# Patient Record
Sex: Female | Born: 1975 | Race: White | Hispanic: No | Marital: Married | Smoking: Former smoker
Health system: Southern US, Community
[De-identification: ages and names within clinical notes are randomized; demographics above are authoritative.]

## PROBLEM LIST (undated history)

## (undated) DIAGNOSIS — E669 Obesity, unspecified: Secondary | ICD-10-CM

## (undated) DIAGNOSIS — D649 Anemia, unspecified: Secondary | ICD-10-CM

## (undated) DIAGNOSIS — Z933 Colostomy status: Secondary | ICD-10-CM

## (undated) DIAGNOSIS — E538 Deficiency of other specified B group vitamins: Secondary | ICD-10-CM

## (undated) DIAGNOSIS — E559 Vitamin D deficiency, unspecified: Secondary | ICD-10-CM

## (undated) DIAGNOSIS — B009 Herpesviral infection, unspecified: Secondary | ICD-10-CM

## (undated) DIAGNOSIS — Z1621 Resistance to vancomycin: Secondary | ICD-10-CM

## (undated) DIAGNOSIS — A498 Other bacterial infections of unspecified site: Secondary | ICD-10-CM

## (undated) DIAGNOSIS — I2699 Other pulmonary embolism without acute cor pulmonale: Secondary | ICD-10-CM

## (undated) DIAGNOSIS — R739 Hyperglycemia, unspecified: Secondary | ICD-10-CM

## (undated) DIAGNOSIS — N179 Acute kidney failure, unspecified: Secondary | ICD-10-CM

## (undated) DIAGNOSIS — K651 Peritoneal abscess: Secondary | ICD-10-CM

## (undated) DIAGNOSIS — D75839 Thrombocytosis, unspecified: Secondary | ICD-10-CM

## (undated) DIAGNOSIS — K912 Postsurgical malabsorption, not elsewhere classified: Secondary | ICD-10-CM

## (undated) DIAGNOSIS — F419 Anxiety disorder, unspecified: Secondary | ICD-10-CM

## (undated) DIAGNOSIS — R000 Tachycardia, unspecified: Secondary | ICD-10-CM

## (undated) DIAGNOSIS — M8580 Other specified disorders of bone density and structure, unspecified site: Secondary | ICD-10-CM

## (undated) DIAGNOSIS — G4700 Insomnia, unspecified: Secondary | ICD-10-CM

## (undated) DIAGNOSIS — K5090 Crohn's disease, unspecified, without complications: Secondary | ICD-10-CM

## (undated) DIAGNOSIS — E875 Hyperkalemia: Secondary | ICD-10-CM

## (undated) DIAGNOSIS — F32A Depression, unspecified: Secondary | ICD-10-CM

## (undated) DIAGNOSIS — A491 Streptococcal infection, unspecified site: Secondary | ICD-10-CM

## (undated) DIAGNOSIS — K599 Functional intestinal disorder, unspecified: Secondary | ICD-10-CM

## (undated) DIAGNOSIS — E876 Hypokalemia: Secondary | ICD-10-CM

## (undated) DIAGNOSIS — E785 Hyperlipidemia, unspecified: Secondary | ICD-10-CM

## (undated) DIAGNOSIS — B342 Coronavirus infection, unspecified: Secondary | ICD-10-CM

## (undated) DIAGNOSIS — F329 Major depressive disorder, single episode, unspecified: Secondary | ICD-10-CM

## (undated) DIAGNOSIS — I499 Cardiac arrhythmia, unspecified: Secondary | ICD-10-CM

## (undated) DIAGNOSIS — J189 Pneumonia, unspecified organism: Secondary | ICD-10-CM

## (undated) DIAGNOSIS — A6920 Lyme disease, unspecified: Secondary | ICD-10-CM

## (undated) DIAGNOSIS — K90829 Short bowel syndrome, unspecified: Secondary | ICD-10-CM

## (undated) DIAGNOSIS — G43909 Migraine, unspecified, not intractable, without status migrainosus: Secondary | ICD-10-CM

## (undated) HISTORY — DX: Crohn's disease, unspecified, without complications: K50.90

## (undated) HISTORY — DX: Postsurgical malabsorption, not elsewhere classified: K91.2

## (undated) HISTORY — DX: Colostomy status: Z93.3

## (undated) HISTORY — DX: Migraine, unspecified, not intractable, without status migrainosus: G43.909

## (undated) HISTORY — DX: Tachycardia, unspecified: R00.0

## (undated) HISTORY — PX: ABDOMINAL HYSTERECTOMY: SHX81

## (undated) HISTORY — DX: Acute kidney failure, unspecified: N17.9

## (undated) HISTORY — DX: Other specified disorders of bone density and structure, unspecified site: M85.80

## (undated) HISTORY — DX: Streptococcal infection, unspecified site: A49.1

## (undated) HISTORY — DX: Deficiency of other specified B group vitamins: E53.8

## (undated) HISTORY — DX: Pneumonia, unspecified organism: J18.9

## (undated) HISTORY — DX: Anemia, unspecified: D64.9

## (undated) HISTORY — DX: Depression, unspecified: F32.A

## (undated) HISTORY — PX: PICC LINE INSERTION: CATH118290

## (undated) HISTORY — DX: Peritoneal abscess: K65.1

## (undated) HISTORY — DX: Cardiac arrhythmia, unspecified: I49.9

## (undated) HISTORY — DX: Other bacterial infections of unspecified site: A49.8

## (undated) HISTORY — DX: Major depressive disorder, single episode, unspecified: F32.9

## (undated) HISTORY — DX: Insomnia, unspecified: G47.00

## (undated) HISTORY — DX: Coronavirus infection, unspecified: B34.2

## (undated) HISTORY — DX: Resistance to vancomycin: Z16.21

## (undated) HISTORY — DX: Hyperlipidemia, unspecified: E78.5

## (undated) HISTORY — DX: Vitamin D deficiency, unspecified: E55.9

## (undated) HISTORY — DX: Other pulmonary embolism without acute cor pulmonale: I26.99

## (undated) HISTORY — DX: Short bowel syndrome, unspecified: K90.829

## (undated) HISTORY — PX: ILEOCECETOMY: SHX5857

## (undated) HISTORY — DX: Obesity, unspecified: E66.9

## (undated) HISTORY — DX: Herpesviral infection, unspecified: B00.9

## (undated) HISTORY — DX: Anxiety disorder, unspecified: F41.9

## (undated) HISTORY — DX: Functional intestinal disorder, unspecified: K59.9

## (undated) HISTORY — DX: Hyperkalemia: E87.5

## (undated) HISTORY — PX: APPENDECTOMY: SHX54

## (undated) HISTORY — DX: Hyperglycemia, unspecified: R73.9

## (undated) HISTORY — DX: Hypokalemia: E87.6

## (undated) HISTORY — DX: Thrombocytosis, unspecified: D75.839

## (undated) HISTORY — DX: Lyme disease, unspecified: A69.20

---

## 2012-12-15 DIAGNOSIS — G5603 Carpal tunnel syndrome, bilateral upper limbs: Secondary | ICD-10-CM | POA: Insufficient documentation

## 2013-03-15 DIAGNOSIS — K5090 Crohn's disease, unspecified, without complications: Secondary | ICD-10-CM | POA: Insufficient documentation

## 2013-08-17 HISTORY — PX: RIGHT COLECTOMY: SHX853

## 2014-10-19 DIAGNOSIS — R519 Headache, unspecified: Secondary | ICD-10-CM | POA: Insufficient documentation

## 2014-10-19 DIAGNOSIS — R51 Headache: Secondary | ICD-10-CM

## 2015-08-18 HISTORY — PX: COLOSTOMY: SHX63

## 2016-04-30 DIAGNOSIS — R002 Palpitations: Secondary | ICD-10-CM | POA: Insufficient documentation

## 2017-01-12 ENCOUNTER — Encounter: Payer: Self-pay | Admitting: Neurology

## 2017-03-08 DIAGNOSIS — R630 Anorexia: Secondary | ICD-10-CM | POA: Insufficient documentation

## 2017-03-08 DIAGNOSIS — F329 Major depressive disorder, single episode, unspecified: Secondary | ICD-10-CM | POA: Insufficient documentation

## 2017-03-08 DIAGNOSIS — E46 Unspecified protein-calorie malnutrition: Secondary | ICD-10-CM | POA: Insufficient documentation

## 2017-03-08 DIAGNOSIS — F32A Depression, unspecified: Secondary | ICD-10-CM | POA: Insufficient documentation

## 2017-04-01 ENCOUNTER — Ambulatory Visit: Payer: Self-pay | Admitting: Neurology

## 2017-04-14 ENCOUNTER — Ambulatory Visit (INDEPENDENT_AMBULATORY_CARE_PROVIDER_SITE_OTHER): Payer: 59 | Admitting: Neurology

## 2017-04-14 ENCOUNTER — Encounter: Payer: Self-pay | Admitting: Neurology

## 2017-04-14 VITALS — BP 100/78 | HR 96 | Ht 66.0 in | Wt 108.8 lb

## 2017-04-14 DIAGNOSIS — G4440 Drug-induced headache, not elsewhere classified, not intractable: Secondary | ICD-10-CM

## 2017-04-14 NOTE — Patient Instructions (Signed)
Headaches are likely related to overuse of Imitrex, dehydration, complications of Crohn's and depression 1.  You must stop use of Imitrex 2.  Increase water intake 3.  Follow up in 3 months and we can consider starting a medication if needed.

## 2017-04-14 NOTE — Progress Notes (Signed)
NEUROLOGY CONSULTATION NOTE  Stacey Simpson MRN: [id number] DOB: June 25, 1976  Referring provider: Valaria Good, PA-C Primary care provider: Valaria Good, PA-C  Reason for consult:  headache  HISTORY OF PRESENT ILLNESS: Trivia Heffelfinger is a 41 year old female with Crohn's disease who presents for headache.  She is accompanied by her husband who supplements history.  In 2015, she sustained a tick bite.  She developed a rash.  She developed a headache soon afterward.  It is a constant daily holocephalic headache, fluctuating from 7 to 10/10.  It feels like her "brain is swelling" and is going to explode.  She has associated nausea and photosensitivity.  She underwent a workup.  MRI of brain with and without contrast from 07/30/14 was reportedly unremarkable.  MRI of cervical spine from 11/03/14 demonstrated moderate to severe right neural foraminal stenosis at C5-6.  She underwent blood work, including Lyme, and was found to have slightly elevated RMSF IgM but IgG antibodies were negative.  She was treated with doxycycline for several weeks.  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  No further recommendations were made.  She started taking sumatriptan 54m daily in 2016.  She was also started on topiramate 1021mtwice daily.  She has a complicated medical history.  She has Crohn's disease with multiple surgeries, including colon resection.  She has an ostomy.  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  Earlier this summer, she was found to be hypokalemic with a K level of 1.9.  She had been taking supplements.  She also reports history of paroxysmal atrial fibrillation, although she is not on anticoagulation or beta blockers.  She has significant depression and takes sertraline, Trintellix and Seroquel.  Due to her Crohn's, she cannot take NSAIDs.  Tylenol is ineffective.  She takes promethazine for nausea (previously on Zofran).  She denies prior history of  headache.  8/21-22/18: CMP with Na 138, K 3.3, Cl 108, CO2 26, BUN 12, Cr 0.82, glucose 79, total bili 0.3, ALP 80, AST 13 and ALT 18; CBC with WBC 15.1, HGB 12.1, HCT 38.4 and PLT 531;B12 394  PAST MEDICAL HISTORY: No past medical history on file.  PAST SURGICAL HISTORY: No past surgical history on file.  MEDICATIONS: No current outpatient prescriptions on file prior to visit.   No current facility-administered medications on file prior to visit.     ALLERGIES: Allergies not on file  FAMILY HISTORY: No family history on file. Patient is adopted..  SOCIAL HISTORY: Social History   Social History  . Marital status: Married    Spouse name: N/A  . Number of children: N/A  . Years of education: N/A   Occupational History  . disabled    Social History Main Topics  . Smoking status: Current Every Day Smoker    Packs/day: 0.25    Types: Cigarettes  . Smokeless tobacco: Never Used  . Alcohol use No  . Drug use: No  . Sexual activity: Not on file   Other Topics Concern  . Not on file   Social History Narrative   Married, lives with hsbnd   No pets   1 story, has steps to go inside    REVIEW OF SYSTEMS: Constitutional: No fevers, chills, or sweats, no generalized fatigue, change in appetite Eyes: No visual changes, double vision, eye pain Ear, nose and throat: No hearing loss, ear pain, nasal congestion, sore throat Cardiovascular: No chest pain, palpitations Respiratory:  No shortness of breath at rest or  with exertion, wheezes GastrointestinaI: as above Genitourinary:  No dysuria, urinary retention or frequency Musculoskeletal:  No neck pain, back pain Integumentary: No rash, pruritus, skin lesions Neurological: as above Psychiatric: depression Endocrine: No palpitations, fatigue, diaphoresis, mood swings, change in appetite, change in weight, increased thirst Hematologic/Lymphatic:  No purpura, petechiae. Allergic/Immunologic: no itchy/runny eyes, nasal  congestion, recent allergic reactions, rashes  PHYSICAL EXAM: Vitals:   04/14/17 1329  BP: 100/78  Pulse: 96  SpO2: 99%   General: No acute distress.  Thin. Head:  Normocephalic/atraumatic Eyes:  fundi examined but not visualized Neck: supple, no paraspinal tenderness, full range of motion Back: No paraspinal tenderness Heart: regular rate and rhythm Lungs: Clear to auscultation bilaterally. Vascular: No carotid bruits. Neurological Exam: Mental status: alert and oriented to person, place, and time, recent and remote memory intact, fund of knowledge intact, attention and concentration intact, speech fluent and not dysarthric, language intact. Cranial nerves: CN I: not tested CN II: pupils equal, round and reactive to light, visual fields intact CN III, IV, VI:  full range of motion, no nystagmus, no ptosis CN V: facial sensation intact CN VII: upper and lower face symmetric CN VIII: hearing intact CN IX, X: gag intact, uvula midline CN XI: sternocleidomastoid and trapezius muscles intact CN XII: tongue midline Bulk & Tone: normal, no fasciculations. Motor:  5/5 throughout  Sensation: temperature and vibration sensation intact. Deep Tendon Reflexes:  2+ throughout, toes downgoing.  Finger to nose testing:  Without dysmetria.  Heel to shin:  Without dysmetria.  Gait:  Normal station and stride.  Able to turn and tandem walk. Romberg negative.  IMPRESSION: Medication overuse headache.    PLAN: Headache is not believed to be a specific isolated headache syndrome but likely sequela of medication-overuse of sumatriptan, depression and complications of her Crohn's (such as malabsorption and dehydration) 1.  Recommend discontinuing sumatriptan (I informed her that headaches will get worse before they eventually improve). 2.  Increase water intake and insure proper vitamin supplementation 3.  Treatment for depression 4.  Follow up in 3 months.  Thank you for allowing me to take  part in the care of this patient.  Metta Clines, DO  CC:  Valaria Good, PA-C

## 2017-04-21 DIAGNOSIS — F17200 Nicotine dependence, unspecified, uncomplicated: Secondary | ICD-10-CM | POA: Insufficient documentation

## 2017-07-01 ENCOUNTER — Ambulatory Visit: Payer: 59 | Admitting: Neurology

## 2017-07-17 DIAGNOSIS — F339 Major depressive disorder, recurrent, unspecified: Secondary | ICD-10-CM | POA: Insufficient documentation

## 2018-02-18 DIAGNOSIS — R1115 Cyclical vomiting syndrome unrelated to migraine: Secondary | ICD-10-CM | POA: Diagnosis present

## 2018-02-18 DIAGNOSIS — K50818 Crohn's disease of both small and large intestine with other complication: Secondary | ICD-10-CM | POA: Diagnosis present

## 2018-02-18 DIAGNOSIS — K5080 Crohn's disease of both small and large intestine without complications: Secondary | ICD-10-CM | POA: Insufficient documentation

## 2018-04-11 ENCOUNTER — Encounter: Payer: Self-pay | Admitting: Gastroenterology

## 2018-04-20 ENCOUNTER — Ambulatory Visit: Payer: 59 | Admitting: Gastroenterology

## 2018-05-09 ENCOUNTER — Encounter: Payer: Self-pay | Admitting: Gastroenterology

## 2018-05-11 ENCOUNTER — Other Ambulatory Visit (INDEPENDENT_AMBULATORY_CARE_PROVIDER_SITE_OTHER): Payer: Managed Care, Other (non HMO)

## 2018-05-11 ENCOUNTER — Encounter: Payer: Self-pay | Admitting: Gastroenterology

## 2018-05-11 ENCOUNTER — Ambulatory Visit (INDEPENDENT_AMBULATORY_CARE_PROVIDER_SITE_OTHER): Payer: Managed Care, Other (non HMO) | Admitting: Gastroenterology

## 2018-05-11 VITALS — BP 102/80 | HR 88 | Ht 66.0 in | Wt 137.2 lb

## 2018-05-11 DIAGNOSIS — R197 Diarrhea, unspecified: Secondary | ICD-10-CM

## 2018-05-11 DIAGNOSIS — K912 Postsurgical malabsorption, not elsewhere classified: Secondary | ICD-10-CM | POA: Diagnosis not present

## 2018-05-11 DIAGNOSIS — E46 Unspecified protein-calorie malnutrition: Secondary | ICD-10-CM

## 2018-05-11 DIAGNOSIS — K50919 Crohn's disease, unspecified, with unspecified complications: Secondary | ICD-10-CM

## 2018-05-11 DIAGNOSIS — K90829 Short bowel syndrome, unspecified: Secondary | ICD-10-CM

## 2018-05-11 LAB — CBC WITH DIFFERENTIAL/PLATELET
BASOS ABS: 0 10*3/uL (ref 0.0–0.1)
BASOS PCT: 0.4 % (ref 0.0–3.0)
EOS ABS: 0.1 10*3/uL (ref 0.0–0.7)
Eosinophils Relative: 1.2 % (ref 0.0–5.0)
HCT: 41.9 % (ref 36.0–46.0)
Hemoglobin: 14.3 g/dL (ref 12.0–15.0)
Lymphocytes Relative: 31.4 % (ref 12.0–46.0)
Lymphs Abs: 3.4 10*3/uL (ref 0.7–4.0)
MCHC: 34.1 g/dL (ref 30.0–36.0)
MCV: 85.3 fl (ref 78.0–100.0)
MONO ABS: 0.4 10*3/uL (ref 0.1–1.0)
Monocytes Relative: 3.9 % (ref 3.0–12.0)
NEUTROS ABS: 6.9 10*3/uL (ref 1.4–7.7)
NEUTROS PCT: 63.1 % (ref 43.0–77.0)
PLATELETS: 468 10*3/uL — AB (ref 150.0–400.0)
RBC: 4.92 Mil/uL (ref 3.87–5.11)
RDW: 12.9 % (ref 11.5–15.5)
WBC: 10.9 10*3/uL — ABNORMAL HIGH (ref 4.0–10.5)

## 2018-05-11 LAB — COMPREHENSIVE METABOLIC PANEL
ALBUMIN: 4.5 g/dL (ref 3.5–5.2)
ALT: 11 U/L (ref 0–35)
AST: 13 U/L (ref 0–37)
Alkaline Phosphatase: 129 U/L — ABNORMAL HIGH (ref 39–117)
BILIRUBIN TOTAL: 0.3 mg/dL (ref 0.2–1.2)
BUN: 9 mg/dL (ref 6–23)
CHLORIDE: 103 meq/L (ref 96–112)
CO2: 27 meq/L (ref 19–32)
Calcium: 10.2 mg/dL (ref 8.4–10.5)
Creatinine, Ser: 1.22 mg/dL — ABNORMAL HIGH (ref 0.40–1.20)
GFR: 50.82 mL/min — AB (ref >60.00)
Glucose, Bld: 84 mg/dL (ref 70–99)
Potassium: 3.7 mEq/L (ref 3.5–5.1)
Sodium: 141 mEq/L (ref 135–145)
Total Protein: 7.8 g/dL (ref 6.0–8.3)

## 2018-05-11 LAB — C-REACTIVE PROTEIN: CRP: 0.4 mg/dL — ABNORMAL LOW (ref 0.5–20.0)

## 2018-05-11 NOTE — Patient Instructions (Addendum)
If you are age 44 or older, your body mass index should be between 23-30. Your Body mass index is 22.15 kg/m. If this is out of the aforementioned range listed, please consider follow up with your Primary Care Provider.  If you are age 63 or younger, your body mass index should be between 19-25. Your Body mass index is 22.15 kg/m. If this is out of the aformentioned range listed, please consider follow up with your Primary Care Provider.   Please go to the lab on the 2nd floor [address] before you leave the office today.   Your provider has referred you to Dr. Johney Maine, their office will contact you with an appointment.  Please make you an appointment to follow up with Dr .Celene Kras.    Thank you,  Dr. Jackquline Denmark

## 2018-05-11 NOTE — Addendum Note (Signed)
Addended by: Harl Bowie on: 05/11/2018 12:21 PM   Modules accepted: Orders

## 2018-05-11 NOTE — Progress Notes (Signed)
Chief Complaint: FU   Referring Provider:  Valaria Good, PA-C      ASSESSMENT AND PLAN;   #1.  Short gut syndrome (doesnot want to try Gattex due to malignancy potential) #2.  Crohn's disease: Dx at age 42,([place], [place]), status post right hemicolectomy with TI resection at the age 89, status post left colonic resection due to abscess with ileostomy at [facility], failed Humira, Cimzia, Remicade and most recently Entyvio.  Has been to multiple gastroenterologists at multiple centers.  Multiple admissions.  Most recently at [facility] transferred to [facility]. S/P EGD and colon (Dr Dorrene German) 02/21/2018 (Nl EGD, colon normal neo-TI and colon, neg Bx). CT 02/2018: skip lesions but minimal inflammation. #3.  Cyclical nausea/vomiting. Neg EGD, CT neg for SBO. #4.  Comorbid conditions including anxiety/depression.  Plan: - Appt with [name] for port placement for IVF/TPN. (per pt TPN has been approved by [facility]). Also ? Rectal prolapse. Once port is placed, will have pharmacy initiate home TPN as per protocol. - Check CBC, CMP, CRP, B12,Mg, celiac screen today. - Stool GI pathogens, WBC and fecal calprotectin. - Quit smoking. - FU appt with Dr Renee Harder [facility] IBD clinic. - FU 8 weeks. No indication for steroids at the present time since last EGD and colonoscopy did not show any significant inflammation.  However, if the C-reactive protein is high or there are any other signs of inflammation, we will reconsider. - Continue using Lomotil and cholestyramine - Please obtain previous records. Follow VIP and Serotonin assay from [facility].  HPI:    Stacey Simpson is a 42 y.o. female  Very complicated unfortunate patient Has been to multiple gastroenterologists at multiple centers. with history of Crohn's disease- Dx at age 42,([place], [place]), status post right hemicolectomy with TI resection at the age 69, status post left colonic resection due to abscess with ileostomy at [facility], failed Humira, Cimzia,  Remicade and most recently Entyvio.  Has been to multiple gastroenterologists at multiple centers.  Multiple admissions.  Most recently at [facility] transferred to [facility]. S/P EGD and colon (Dr Dorrene German) 02/21/2018 (Nl EGD, colon normal neo-TI and colon, neg Bx). CT 02/2018: skip lesions but minimal inflammation.  With continued diarrhea -empties her ileostomy bag 20 times a day -watery bowel movements without any blood.  She also has intractable intermittent nausea/vomiting   her symptoms were attributed to possible short bowel syndrome and cyclical nausea/vomiting with electrolyte abnormalities including hypo-kalemia, hypomagnesemia, ARI with dehydration, malnutrition. She has required multiple hospital visits for IV fluids and TPN.  Per patient, IV fluids and home TPN has been approved by [employer] but they need physician support. She has been followed by Dr. Dorrene German at [facility]. Pt would like [place] to initiate TPN.  She understands the risks and benefits including risks of of infections -local and systemic which potentially can be life-threatening and may require hospitalization.   Past Medical History:  Diagnosis Date  . Anxiety   . Crohn's disease ([place])   . Herpes   . Hypokalemia   . Insomnia   . Lyme disease   . Major depressive disorder   . Migraines   . Short bowel syndrome   . Tachycardia   . Vitamin B12 deficiency     Past Surgical History:  Procedure Laterality Date  . ABDOMINAL HYSTERECTOMY    . APPENDECTOMY    . COLON RESECTION     x3, prolapsed colon, colostomy bag    Family History  Adopted: Yes  Family history  unknown: Yes    Social History   Tobacco Use  . Smoking status: Current Every Day Smoker    Packs/day: 0.25    Types: Cigarettes  . Smokeless tobacco: Never Used  . Tobacco comment: working on quiting  Substance Use Topics  . Alcohol use: No  . Drug use: No    Current Outpatient Medications  Medication Sig Dispense Refill  . clonazePAM  (KLONOPIN) 0.5 MG tablet Take 0.5 mg by mouth as directed.    . promethazine (PHENERGAN) 25 MG tablet Take 25 mg by mouth as needed.    Marland Kitchen QUEtiapine (SEROQUEL) 100 MG tablet Take 50 mg by mouth at bedtime.     . SUMAtriptan (IMITREX) 25 MG tablet Take 25 mg by mouth as needed.    . topiramate (TOPAMAX) 100 MG tablet Take 100 mg by mouth 2 (two) times daily.    Marland Kitchen vortioxetine HBr (TRINTELLIX) 20 MG TABS Take 20 mg by mouth daily.     No current facility-administered medications for this visit.     Not on File  Review of Systems:  Constitutional: Denies fever, chills, diaphoresis, appetite change and fatigue.  HEENT: Denies photophobia, eye pain, redness, hearing loss, ear pain, congestion, sore throat, rhinorrhea, sneezing, mouth sores, neck pain, neck stiffness and tinnitus.   Respiratory: Denies SOB, DOE, cough, chest tightness,  and wheezing.   Cardiovascular: Denies chest pain, palpitations and leg swelling.  Genitourinary: Denies dysuria, urgency, frequency, hematuria, flank pain and difficulty urinating.  Musculoskeletal: Denies myalgias, back pain, joint swelling, arthralgias and gait problem.  Skin: No rash.  Neurological: Has dizziness. No seizures, syncope, weakness, light-headedness, numbness and headaches.  Hematological: Denies adenopathy. Easy bruising, personal or family bleeding history  Psychiatric/Behavioral: has anxiety or depression     Physical Exam:    BP 102/80   Pulse 88   Ht 5' 6"  (1.676 m)   Wt 137 lb 4 oz (62.3 kg)   BMI 22.15 kg/m  Filed Weights   05/11/18 0900  Weight: 137 lb 4 oz (62.3 kg)   Constitutional:  Well-developed, in no acute distress. Psychiatric: Normal mood and affect. Behavior is normal. HEENT: Pupils normal.  Conjunctivae are normal. No scleral icterus. Neck supple.  Cardiovascular: Normal rate, regular rhythm. No edema Pulmonary/chest: Effort normal and breath sounds normal. No wheezing, rales or rhonchi. Abdominal: Soft,  nondistended. Nontender. Bowel sounds active throughout. There are no masses palpable. No hepatomegaly. Has ostomy Rectal:  defered Neurological: Alert and oriented to person place and time. Skin: Skin is warm and dry. No rashes noted.   Carmell Austria, MD 05/11/2018, 9:39 AM  Cc: Valaria Good, PA-C

## 2018-05-12 LAB — CELIAC PANEL 10
Antigliadin Abs, IgA: 5 units (ref 0–19)
ENDOMYSIAL IGA: NEGATIVE
Gliadin IgG: 5 units (ref 0–19)
IgA/Immunoglobulin A, Serum: 261 mg/dL (ref 87–352)
Transglutaminase IgA: <2 U/mL (ref 0–3)

## 2018-05-13 ENCOUNTER — Telehealth: Payer: Self-pay | Admitting: Gastroenterology

## 2018-05-13 NOTE — Telephone Encounter (Signed)
Patient states she is calling nurse Vaughan Basta back. Patient states she does not remember getting lab results and would like a call to discuss.

## 2018-05-13 NOTE — Telephone Encounter (Signed)
Spoke with pt, see result notes.

## 2018-05-16 LAB — FECAL LACTOFERRIN, QUANT
FECAL LACTOFERRIN: NEGATIVE
MICRO NUMBER: 91152535
SPECIMEN QUALITY: ADEQUATE

## 2018-05-16 LAB — CALPROTECTIN: CALPROTECTIN: 28.7 ug/g

## 2018-05-16 LAB — GASTROINTESTINAL PATHOGEN PANEL PCR
C. difficile Tox A/B, PCR: NOT DETECTED
CRYPTOSPORIDIUM, PCR: NOT DETECTED
Campylobacter, PCR: NOT DETECTED
E COLI (ETEC) LT/ST, PCR: NOT DETECTED
E COLI 0157, PCR: NOT DETECTED
E coli (STEC) stx1/stx2, PCR: NOT DETECTED
GIARDIA LAMBLIA, PCR: NOT DETECTED
Norovirus, PCR: NOT DETECTED
Rotavirus A, PCR: NOT DETECTED
Salmonella, PCR: NOT DETECTED
Shigella, PCR: NOT DETECTED

## 2018-05-17 ENCOUNTER — Telehealth: Payer: Self-pay

## 2018-05-17 ENCOUNTER — Other Ambulatory Visit (INDEPENDENT_AMBULATORY_CARE_PROVIDER_SITE_OTHER): Payer: Managed Care, Other (non HMO)

## 2018-05-17 DIAGNOSIS — K50919 Crohn's disease, unspecified, with unspecified complications: Secondary | ICD-10-CM

## 2018-05-17 NOTE — Telephone Encounter (Signed)
Patient will come by today to have blood work done.

## 2018-05-18 LAB — MAGNESIUM: Magnesium: 2.2 mg/dL (ref 1.5–2.5)

## 2018-05-24 ENCOUNTER — Telehealth: Payer: Self-pay | Admitting: Gastroenterology

## 2018-05-24 NOTE — Telephone Encounter (Signed)
Left message for Stacey Simpson at [facility] to call back regarding appt.  Spoke with Stacey Simpson at [employer] and she did not see referral in proficient. [name] can you help with this?

## 2018-05-31 NOTE — Telephone Encounter (Signed)
Patient was scheduled for appointment on 05/26/18 and she canceled it. She was rescheduled for 05/31/18 and no showed.

## 2018-06-04 ENCOUNTER — Emergency Department (HOSPITAL_COMMUNITY)
Admission: EM | Admit: 2018-06-04 | Discharge: 2018-06-04 | Disposition: A | Discharge status: Home / Self Care | Payer: Managed Care, Other (non HMO) | Attending: Emergency Medicine | Admitting: Emergency Medicine

## 2018-06-04 ENCOUNTER — Encounter (HOSPITAL_COMMUNITY): Payer: Self-pay | Admitting: Nurse Practitioner

## 2018-06-04 DIAGNOSIS — R21 Rash and other nonspecific skin eruption: Secondary | ICD-10-CM | POA: Diagnosis not present

## 2018-06-04 DIAGNOSIS — R112 Nausea with vomiting, unspecified: Secondary | ICD-10-CM | POA: Insufficient documentation

## 2018-06-04 DIAGNOSIS — F17210 Nicotine dependence, cigarettes, uncomplicated: Secondary | ICD-10-CM | POA: Diagnosis not present

## 2018-06-04 DIAGNOSIS — R519 Headache, unspecified: Secondary | ICD-10-CM

## 2018-06-04 DIAGNOSIS — Z79899 Other long term (current) drug therapy: Secondary | ICD-10-CM | POA: Diagnosis not present

## 2018-06-04 DIAGNOSIS — R51 Headache: Secondary | ICD-10-CM | POA: Insufficient documentation

## 2018-06-04 LAB — COMPREHENSIVE METABOLIC PANEL
ALT: 12 U/L (ref 0–44)
AST: 16 U/L (ref 15–41)
Albumin: 3.6 g/dL (ref 3.5–5.0)
Alkaline Phosphatase: 99 U/L (ref 38–126)
Anion gap: 9 (ref 5–15)
BUN: 7 mg/dL (ref 6–20)
CALCIUM: 9 mg/dL (ref 8.9–10.3)
CHLORIDE: 108 mmol/L (ref 98–111)
CO2: 22 mmol/L (ref 22–32)
CREATININE: 1.1 mg/dL — AB (ref 0.44–1.00)
GFR calc non Af Amer: >60 mL/min (ref >60)
Glucose, Bld: 86 mg/dL (ref 70–99)
Potassium: 3.1 mmol/L — ABNORMAL LOW (ref 3.5–5.1)
SODIUM: 139 mmol/L (ref 135–145)
Total Bilirubin: 0.5 mg/dL (ref 0.3–1.2)
Total Protein: 7.2 g/dL (ref 6.5–8.1)

## 2018-06-04 LAB — CBC WITH DIFFERENTIAL/PLATELET
ABS IMMATURE GRANULOCYTES: 0.04 10*3/uL (ref 0.00–0.07)
Basophils Absolute: 0 10*3/uL (ref 0.0–0.1)
Basophils Relative: 0 %
Eosinophils Absolute: 0.1 10*3/uL (ref 0.0–0.5)
Eosinophils Relative: 1 %
HCT: 40.5 % (ref 36.0–46.0)
HEMOGLOBIN: 13 g/dL (ref 12.0–15.0)
Immature Granulocytes: 0 %
Lymphocytes Relative: 30 %
Lymphs Abs: 3.2 10*3/uL (ref 0.7–4.0)
MCH: 28.2 pg (ref 26.0–34.0)
MCHC: 32.1 g/dL (ref 30.0–36.0)
MCV: 87.9 fL (ref 80.0–100.0)
MONO ABS: 0.6 10*3/uL (ref 0.1–1.0)
Monocytes Relative: 6 %
NEUTROS ABS: 6.7 10*3/uL (ref 1.7–7.7)
Neutrophils Relative %: 63 %
PLATELETS: 420 10*3/uL — AB (ref 150–400)
RBC: 4.61 MIL/uL (ref 3.87–5.11)
RDW: 13.1 % (ref 11.5–15.5)
WBC: 10.7 10*3/uL — AB (ref 4.0–10.5)
nRBC: 0 % (ref 0.0–0.2)

## 2018-06-04 MED ORDER — PROCHLORPERAZINE EDISYLATE 10 MG/2ML IJ SOLN
10.0000 mg | Freq: Once | INTRAMUSCULAR | Status: AC
  Administered 2018-06-04: 10 mg via INTRAVENOUS
  Filled 2018-06-04: qty 2

## 2018-06-04 MED ORDER — DIPHENHYDRAMINE HCL 50 MG/ML IJ SOLN
25.0000 mg | Freq: Once | INTRAMUSCULAR | Status: AC
  Administered 2018-06-04: 25 mg via INTRAVENOUS
  Filled 2018-06-04: qty 1

## 2018-06-04 MED ORDER — SODIUM CHLORIDE 0.9 % IV BOLUS
1000.0000 mL | Freq: Once | INTRAVENOUS | Status: AC
  Administered 2018-06-04: 1000 mL via INTRAVENOUS

## 2018-06-04 NOTE — ED Notes (Signed)
20g IV in Lt AC by EMS unable to flush and red, IV immediately DC and new IV access established

## 2018-06-04 NOTE — ED Provider Notes (Signed)
[facility] DEPT Provider Note   CSN: [id number] Arrival date & time: 06/04/18  1504     History   Chief Complaint Chief Complaint  Patient presents with  . Migraine    HPI Stacey Simpson is a 42 y.o. female.  42 yo F with a chief complaint of headache nausea and vomiting.  Headache is been going on for the past 3 days.  Feels like her typical headache.  Is gotten worse over the past 4 to 6 hours.  Typically she takes Imitrex for this and took a dose and felt that her headache had worsened.  She is concerned because she said she was diagnosed with chronic Lyme disease a few years back.  She thinks maybe it has recurred.  She sometimes gets a rash with her headache and thinks that they are related.  She initially stated that she had undiagnosed [facility] spotted fever in 2015.  She does have a history of headaches and sees a neurologist.  She has a high output ostomy per her.  Felt that she is been not keeping up with her output with her headache and vomiting.  She checked her temperature just prior to arrival and said her temperature was 106.1 orally.  This had somehow resolved when EMS arrived and she is 98.6 here.  She denies taking any antipyretics.  Old records were reviewed.  Per the neurology note the patient had a work-up for headaches and was found to be negative for Lyme but a positive for RMSF IgG titer but negative for IgM.  She had seen ID at that time and was started on a prolonged course of doxycycline.  Has been seeing neurology for chronic headaches since.  The history is provided by the patient.  Migraine  This is a new problem. The current episode started more than 2 days ago. The problem occurs constantly. The problem has been gradually worsening. Associated symptoms include headaches. Pertinent negatives include no chest pain and no shortness of breath. Nothing aggravates the symptoms. Nothing relieves the symptoms. She has tried nothing  for the symptoms. The treatment provided no relief.    Past Medical History:  Diagnosis Date  . Anxiety   . Crohn's disease ([place])   . Herpes   . Hypokalemia   . Insomnia   . Lyme disease   . Major depressive disorder   . Migraines   . Short bowel syndrome   . Tachycardia   . Vitamin B12 deficiency     There are no active problems to display for this patient.   Past Surgical History:  Procedure Laterality Date  . ABDOMINAL HYSTERECTOMY    . APPENDECTOMY    . COLON RESECTION     x3, prolapsed colon, colostomy bag     OB History   None      Home Medications    Prior to Admission medications   Medication Sig Start Date End Date Taking? Authorizing Provider  clonazePAM (KLONOPIN) 0.5 MG tablet Take 0.5 mg by mouth as directed. 03/29/17   [provider]  Oxycodone HCl 10 MG TABS Take 10 mg by mouth every 8 (eight) hours as needed for pain. 03/02/18   [provider]  promethazine (PHENERGAN) 25 MG tablet Take 25 mg by mouth as needed.    [provider]  sertraline (ZOLOFT) 100 MG tablet Take 100 mg by mouth daily. 05/26/18   [provider]  SUMAtriptan (IMITREX) 25 MG tablet Take 25 mg by mouth  as needed.    [provider]  topiramate (TOPAMAX) 100 MG tablet Take 100 mg by mouth 2 (two) times daily. 04/05/17   [provider]  traZODone (DESYREL) 100 MG tablet Take 50 mg by mouth at bedtime.    [provider]  vortioxetine HBr (TRINTELLIX) 20 MG TABS Take 20 mg by mouth daily. 07/23/16   [provider]    Family History Family History  Adopted: Yes  Family history unknown: Yes    Social History Social History   Tobacco Use  . Smoking status: Current Every Day Smoker    Packs/day: 0.25    Types: Cigarettes  . Smokeless tobacco: Never Used  . Tobacco comment: working on quiting  Substance Use Topics  . Alcohol use: No  . Drug use: No     Allergies   Bioflavonoids and  Other   Review of Systems Review of Systems  Constitutional: Positive for fever. Negative for chills.  HENT: Negative for congestion and rhinorrhea.   Eyes: Negative for redness and visual disturbance.  Respiratory: Negative for shortness of breath and wheezing.   Cardiovascular: Negative for chest pain and palpitations.  Gastrointestinal: Positive for nausea and vomiting.  Genitourinary: Negative for dysuria and urgency.  Musculoskeletal: Negative for arthralgias and myalgias.  Skin: Negative for pallor and wound.  Neurological: Positive for headaches. Negative for dizziness.     Physical Exam Updated Vital Signs BP 104/76   Pulse 70   Temp 98.6 F (37 C) (Oral)   Resp 16   SpO2 99%   Physical Exam  Constitutional: She is oriented to person, place, and time. She appears well-developed and well-nourished. No distress.  HENT:  Head: Normocephalic and atraumatic.  Eyes: Pupils are equal, round, and reactive to light. EOM are normal.  Neck: Normal range of motion. Neck supple.  Cardiovascular: Normal rate and regular rhythm. Exam reveals no gallop and no friction rub.  No murmur heard. Pulmonary/Chest: Effort normal. She has no wheezes. She has no rales.  Abdominal: Soft. She exhibits no distension and no mass. There is no tenderness. There is no guarding.  Ostomy in place  Musculoskeletal: She exhibits no edema or tenderness.  Neurological: She is alert and oriented to person, place, and time. She has normal strength. No cranial nerve deficit or sensory deficit. Coordination normal. GCS eye subscore is 4. GCS verbal subscore is 5. GCS motor subscore is 6.  Skin: Skin is warm and dry. Rash noted. She is not diaphoretic.  A few scattered erythematous macules to the left flank left thigh.  Psychiatric: She has a normal mood and affect. Her behavior is normal.  Nursing note and vitals reviewed.    ED Treatments / Results  Labs (all labs ordered are listed, but only abnormal  results are displayed) Labs Reviewed  CBC WITH DIFFERENTIAL/PLATELET - Abnormal; Notable for the following components:      Result Value   WBC 10.7 (*)    Platelets 420 (*)    All other components within normal limits  COMPREHENSIVE METABOLIC PANEL - Abnormal; Notable for the following components:   Potassium 3.1 (*)    Creatinine, Ser 1.10 (*)    All other components within normal limits    EKG None  Radiology No results found.  Procedures Procedures (including critical care time)  Medications Ordered in ED Medications  prochlorperazine (COMPAZINE) injection 10 mg (10 mg Intravenous Given 06/04/18 1636)  diphenhydrAMINE (BENADRYL) injection 25 mg (25 mg Intravenous Given 06/04/18 1636)  sodium chloride 0.9 % bolus 1,000 mL (1,000 mLs Intravenous New Bag/Given 06/04/18 1637)     Initial Impression / Assessment and Plan / ED Course  I have reviewed the triage vital signs and the nursing notes.  Pertinent labs & imaging results that were available during my care of the patient were reviewed by me and considered in my medical decision making (see chart for details).     42 yo F with a chief complaint of a headache.  Going on for the past 3 days.  Feels like her prior.  No neurologic deficits.  Will give a headache cocktail check labs due to short bowel syndrome give fluids and reassess.  Potassium is very mildly low.  Creatinine is at baseline.  White blood cell count is very mildly elevated at 10.7.  Patient is feeling completely better on reassessment post headache cocktail and fluids.  Discharge home.  6:40 PM:  I have discussed the diagnosis/risks/treatment options with the patient and family and believe the pt to be eligible for discharge home to follow-up with PCP, neuro. We also discussed returning to the ED immediately if new or worsening sx occur. We discussed the sx which are most concerning (e.g., sudden worsening pain, fever, inability to tolerate by mouth) that  necessitate immediate return. Medications administered to the patient during their visit and any new prescriptions provided to the patient are listed below.  Medications given during this visit Medications  prochlorperazine (COMPAZINE) injection 10 mg (10 mg Intravenous Given 06/04/18 1636)  diphenhydrAMINE (BENADRYL) injection 25 mg (25 mg Intravenous Given 06/04/18 1636)  sodium chloride 0.9 % bolus 1,000 mL (1,000 mLs Intravenous New Bag/Given 06/04/18 1637)     The patient appears reasonably screen and/or stabilized for discharge and I doubt any other medical condition or other [facility] requiring further screening, evaluation, or treatment in the ED at this time prior to discharge.    Final Clinical Impressions(s) / ED Diagnoses   Final diagnoses:  Bad headache    ED Discharge Orders    None       Deno Etienne, DO 06/04/18 1840

## 2018-06-04 NOTE — ED Triage Notes (Signed)
Pt is presented from home by medics, c/o migraine and associated nausea. She is also c/o malaise that she is associating with recent flu shot.

## 2018-06-04 NOTE — ED Notes (Signed)
IVF of NS initiated at 247m/hr via infusion pump. Last PO intake was yesterday. Approx 2200hrs.

## 2018-06-04 NOTE — ED Notes (Signed)
Pt placed on cont POX monitoring with int NBP monitoring. Comfort measures provided.

## 2018-06-04 NOTE — ED Notes (Signed)
Pt states has sensitivity to light, feels like having a lot of pressure at temporal areas, also behind eyes. Has had some blurry type of vision with increase in HA pain

## 2018-06-04 NOTE — ED Notes (Signed)
Bed: WA21 Expected date:  Expected time:  Means of arrival:  Comments: 42 yo migraine w/ emesis, lethargy x2 days

## 2018-06-04 NOTE — ED Notes (Signed)
Pt arrived via EMS to treatment room 21, c/o HA x 3days, also with N/V, last vomiting episode yesterday am, pt states she has a rash on left side of abdomen, thigh and lt side of buttocks. States feels itchy, w/o pain

## 2018-06-06 ENCOUNTER — Ambulatory Visit: Payer: Self-pay | Admitting: Surgery

## 2018-06-07 NOTE — Patient Instructions (Addendum)
Stacey Simpson  06/07/2018   Your procedure is scheduled on: 06-15-18     Report to [facility] Main  Entrance    Report to Admitting at 1:54 PM    Call this number if you have problems the morning of surgery [phone]    Remember: NO SOLID FOOD AFTER MIDNIGHT THE NIGHT PRIOR TO SURGERY. NOTHING BY MOUTH EXCEPT CLEAR LIQUIDS UNTIL 3 HOURS PRIOR TO [place] SURGERY. PLEASE FINISH ENSURE DRINK PER SURGEON ORDER 3 HOURS PRIOR TO SCHEDULED SURGERY TIME WHICH NEEDS TO BE COMPLETED AT 12:54 PM.     CLEAR LIQUID DIET   Foods Allowed                                                                     Foods Excluded  Coffee and tea, regular and decaf                             liquids that you cannot  Plain Jell-O in any flavor                                             see through such as: Fruit ices (not with fruit pulp)                                     milk, soups, orange juice  Iced Popsicles                                    All solid food Carbonated beverages, regular and diet                                    Cranberry, grape and apple juices Sports drinks like Gatorade Lightly seasoned clear broth or consume(fat free) Sugar, honey syrup  Sample Menu Breakfast                                Lunch                                     Supper Cranberry juice                    Beef broth                            Chicken broth Jell-O                                     Grape juice  Apple juice Coffee or tea                        Jell-O                                      Popsicle                                                Coffee or tea                        Coffee or tea  _____________________________________________________________________    BRUSH YOUR TEETH MORNING OF SURGERY AND RINSE YOUR MOUTH OUT, NO CHEWING GUM CANDY OR MINTS.     Take these medicines the morning of surgery with A SIP OF WATER:  None                              You may not have any metal on your body including hair pins and              piercings  Do not wear jewelry, make-up, lotions, powders or perfumes, deodorant             Do not wear nail polish.  Do not shave  48 hours prior to surgery.     Do not bring valuables to the hospital. [place].  Contacts, dentures or bridgework may not be worn into surgery.       Patients discharged the day of surgery will not be allowed to drive home.  Name and phone number of your driver:Chad Caravello [phone]  Special Instructions: N/A              Please read over the following fact sheets you were given: _____________________________________________________________________  [facility] - Preparing for Surgery Before surgery, you can play an important role.  Because skin is not sterile, your skin needs to be as free of germs as possible.  You can reduce the number of germs on your skin by washing with CHG (chlorahexidine gluconate) soap before surgery.  CHG is an antiseptic cleaner which kills germs and bonds with the skin to continue killing germs even after washing. Please DO NOT use if you have an allergy to CHG or antibacterial soaps.  If your skin becomes reddened/irritated stop using the CHG and inform your nurse when you arrive at Short Stay. Do not shave (including legs and underarms) for at least 48 hours prior to the first CHG shower.  You may shave your face/neck. Please follow these instructions carefully:  1.  Shower with CHG Soap the night before surgery and the  morning of Surgery.  2.  If you choose to wash your hair, wash your hair first as usual with your  normal  shampoo.  3.  After you shampoo, rinse your hair and body thoroughly to remove the  shampoo.                           4.  Use CHG as  you would any other liquid soap.  You can apply chg directly  to the skin and wash                        Gently with a scrungie or clean washcloth.  5.  Apply the CHG Soap to your body ONLY FROM THE NECK DOWN.   Do not use on face/ open                           Wound or open sores. Avoid contact with eyes, ears mouth and genitals (private parts).                       Wash face,  Genitals (private parts) with your normal soap.             6.  Wash thoroughly, paying special attention to the area where your surgery  will be performed.  7.  Thoroughly rinse your body with warm water from the neck down.  8.  DO NOT shower/wash with your normal soap after using and rinsing off  the CHG Soap.                9.  Pat yourself dry with a clean towel.            10.  Wear clean pajamas.            11.  Place clean sheets on your bed the night of your first shower and do not  sleep with pets. Day of Surgery : Do not apply any lotions/deodorants the morning of surgery.  Please wear clean clothes to the hospital/surgery center.  FAILURE TO FOLLOW THESE INSTRUCTIONS MAY RESULT IN THE CANCELLATION OF YOUR SURGERY PATIENT SIGNATURE_________________________________  NURSE SIGNATURE__________________________________  ________________________________________________________________________

## 2018-06-07 NOTE — Progress Notes (Addendum)
06-04-18 (Epic) CBC  05-04-18 (Epic) ECHO  01-25-18 (Chart Everywhere) Stress and EKG

## 2018-06-08 ENCOUNTER — Encounter (HOSPITAL_COMMUNITY)
Admission: RE | Admit: 2018-06-08 | Discharge: 2018-06-08 | Disposition: A | Discharge status: Home / Self Care | Payer: Managed Care, Other (non HMO) | Source: Ambulatory Visit | Attending: Surgery | Admitting: Surgery

## 2018-06-08 ENCOUNTER — Other Ambulatory Visit: Payer: Self-pay

## 2018-06-08 ENCOUNTER — Encounter (HOSPITAL_COMMUNITY): Payer: Self-pay | Admitting: *Deleted

## 2018-06-08 DIAGNOSIS — Z01812 Encounter for preprocedural laboratory examination: Secondary | ICD-10-CM | POA: Diagnosis not present

## 2018-06-08 LAB — BASIC METABOLIC PANEL
Anion gap: 10 (ref 5–15)
BUN: 8 mg/dL (ref 6–20)
CALCIUM: 9.2 mg/dL (ref 8.9–10.3)
CO2: 22 mmol/L (ref 22–32)
Chloride: 111 mmol/L (ref 98–111)
Creatinine, Ser: 1.29 mg/dL — ABNORMAL HIGH (ref 0.44–1.00)
GFR calc Af Amer: 57 mL/min — ABNORMAL LOW (ref >60)
GFR calc non Af Amer: 50 mL/min — ABNORMAL LOW (ref >60)
Glucose, Bld: 100 mg/dL — ABNORMAL HIGH (ref 70–99)
Potassium: 3.6 mmol/L (ref 3.5–5.1)
Sodium: 143 mmol/L (ref 135–145)

## 2018-06-15 ENCOUNTER — Ambulatory Visit (HOSPITAL_COMMUNITY): Payer: Managed Care, Other (non HMO) | Admitting: Certified Registered"

## 2018-06-15 ENCOUNTER — Ambulatory Visit (HOSPITAL_COMMUNITY)
Admission: RE | Admit: 2018-06-15 | Discharge: 2018-06-15 | Disposition: A | Discharge status: Home / Self Care | Payer: Managed Care, Other (non HMO) | Source: Ambulatory Visit | Attending: Surgery | Admitting: Surgery

## 2018-06-15 ENCOUNTER — Ambulatory Visit (HOSPITAL_COMMUNITY): Payer: Managed Care, Other (non HMO)

## 2018-06-15 ENCOUNTER — Encounter (HOSPITAL_COMMUNITY): Payer: Self-pay | Admitting: *Deleted

## 2018-06-15 ENCOUNTER — Encounter (HOSPITAL_COMMUNITY)
Admission: RE | Disposition: A | Discharge status: Home / Self Care | Payer: Self-pay | Source: Ambulatory Visit | Attending: Surgery

## 2018-06-15 ENCOUNTER — Telehealth (HOSPITAL_COMMUNITY): Payer: Self-pay | Admitting: *Deleted

## 2018-06-15 ENCOUNTER — Other Ambulatory Visit: Payer: Self-pay

## 2018-06-15 DIAGNOSIS — K5000 Crohn's disease of small intestine without complications: Secondary | ICD-10-CM | POA: Diagnosis not present

## 2018-06-15 DIAGNOSIS — Z9049 Acquired absence of other specified parts of digestive tract: Secondary | ICD-10-CM | POA: Diagnosis not present

## 2018-06-15 DIAGNOSIS — K912 Postsurgical malabsorption, not elsewhere classified: Secondary | ICD-10-CM | POA: Diagnosis present

## 2018-06-15 DIAGNOSIS — F419 Anxiety disorder, unspecified: Secondary | ICD-10-CM | POA: Insufficient documentation

## 2018-06-15 DIAGNOSIS — M069 Rheumatoid arthritis, unspecified: Secondary | ICD-10-CM | POA: Insufficient documentation

## 2018-06-15 DIAGNOSIS — F509 Eating disorder, unspecified: Secondary | ICD-10-CM | POA: Insufficient documentation

## 2018-06-15 DIAGNOSIS — K210 Gastro-esophageal reflux disease with esophagitis: Secondary | ICD-10-CM | POA: Insufficient documentation

## 2018-06-15 DIAGNOSIS — E7800 Pure hypercholesterolemia, unspecified: Secondary | ICD-10-CM | POA: Insufficient documentation

## 2018-06-15 DIAGNOSIS — K598 Other specified functional intestinal disorders: Secondary | ICD-10-CM

## 2018-06-15 DIAGNOSIS — K5989 Other specified functional intestinal disorders: Secondary | ICD-10-CM

## 2018-06-15 DIAGNOSIS — Z933 Colostomy status: Secondary | ICD-10-CM | POA: Insufficient documentation

## 2018-06-15 DIAGNOSIS — Z79899 Other long term (current) drug therapy: Secondary | ICD-10-CM | POA: Insufficient documentation

## 2018-06-15 DIAGNOSIS — Z433 Encounter for attention to colostomy: Secondary | ICD-10-CM

## 2018-06-15 DIAGNOSIS — K909 Intestinal malabsorption, unspecified: Secondary | ICD-10-CM | POA: Insufficient documentation

## 2018-06-15 DIAGNOSIS — M8580 Other specified disorders of bone density and structure, unspecified site: Secondary | ICD-10-CM | POA: Insufficient documentation

## 2018-06-15 DIAGNOSIS — K529 Noninfective gastroenteritis and colitis, unspecified: Secondary | ICD-10-CM | POA: Insufficient documentation

## 2018-06-15 DIAGNOSIS — R1115 Cyclical vomiting syndrome unrelated to migraine: Secondary | ICD-10-CM | POA: Diagnosis present

## 2018-06-15 DIAGNOSIS — E876 Hypokalemia: Secondary | ICD-10-CM | POA: Diagnosis not present

## 2018-06-15 DIAGNOSIS — Z4889 Encounter for other specified surgical aftercare: Secondary | ICD-10-CM

## 2018-06-15 DIAGNOSIS — K50818 Crohn's disease of both small and large intestine with other complication: Secondary | ICD-10-CM | POA: Diagnosis present

## 2018-06-15 HISTORY — PX: PORTACATH PLACEMENT: SHX2246

## 2018-06-15 SURGERY — INSERTION, TUNNELED CENTRAL VENOUS DEVICE, WITH PORT
Anesthesia: General | Laterality: Left

## 2018-06-15 MED ORDER — PROMETHAZINE HCL 25 MG/ML IJ SOLN: 6.2500 mg | INTRAMUSCULAR | Status: DC | PRN

## 2018-06-15 MED ORDER — ACETAMINOPHEN 500 MG PO TABS
ORAL_TABLET | ORAL | Status: AC
  Administered 2018-06-15: 1000 mg via ORAL
  Filled 2018-06-15: qty 2

## 2018-06-15 MED ORDER — ACETAMINOPHEN 500 MG PO TABS
1000.0000 mg | ORAL_TABLET | ORAL | Status: AC
  Administered 2018-06-15: 1000 mg via ORAL

## 2018-06-15 MED ORDER — ONDANSETRON HCL 4 MG/2ML IJ SOLN
INTRAMUSCULAR | Status: AC
  Filled 2018-06-15: qty 2

## 2018-06-15 MED ORDER — PHENYLEPHRINE 40 MCG/ML (10ML) SYRINGE FOR IV PUSH (FOR BLOOD PRESSURE SUPPORT)
PREFILLED_SYRINGE | INTRAVENOUS | Status: DC | PRN
  Administered 2018-06-15: 160 ug via INTRAVENOUS

## 2018-06-15 MED ORDER — CEFAZOLIN SODIUM-DEXTROSE 2-4 GM/100ML-% IV SOLN
2.0000 g | INTRAVENOUS | Status: AC
  Administered 2018-06-15: 2 g via INTRAVENOUS

## 2018-06-15 MED ORDER — OXYCODONE HCL 5 MG PO TABS
5.0000 mg | ORAL_TABLET | Freq: Once | ORAL | Status: AC | PRN
  Administered 2018-06-15: 5 mg via ORAL

## 2018-06-15 MED ORDER — HEPARIN SOD (PORK) LOCK FLUSH 100 UNIT/ML IV SOLN
INTRAVENOUS | Status: AC
  Filled 2018-06-15: qty 5

## 2018-06-15 MED ORDER — MIDAZOLAM HCL 2 MG/2ML IJ SOLN
INTRAMUSCULAR | Status: DC | PRN
  Administered 2018-06-15: 2 mg via INTRAVENOUS

## 2018-06-15 MED ORDER — CHLORHEXIDINE GLUCONATE CLOTH 2 % EX PADS: 6.0000 | MEDICATED_PAD | Freq: Once | CUTANEOUS | Status: DC

## 2018-06-15 MED ORDER — BUPIVACAINE HCL (PF) 0.25 % IJ SOLN
INTRAMUSCULAR | Status: AC
  Filled 2018-06-15: qty 30

## 2018-06-15 MED ORDER — HEPARIN SOD (PORK) LOCK FLUSH 100 UNIT/ML IV SOLN
INTRAVENOUS | Status: DC | PRN
  Administered 2018-06-15: 100 [IU] via INTRAVENOUS

## 2018-06-15 MED ORDER — LIDOCAINE 2% (20 MG/ML) 5 ML SYRINGE
INTRAMUSCULAR | Status: AC
  Filled 2018-06-15: qty 5

## 2018-06-15 MED ORDER — ROCURONIUM BROMIDE 10 MG/ML (PF) SYRINGE
PREFILLED_SYRINGE | INTRAVENOUS | Status: AC
  Filled 2018-06-15: qty 10

## 2018-06-15 MED ORDER — ROCURONIUM BROMIDE 10 MG/ML (PF) SYRINGE
PREFILLED_SYRINGE | INTRAVENOUS | Status: DC | PRN
  Administered 2018-06-15: 20 mg via INTRAVENOUS

## 2018-06-15 MED ORDER — FENTANYL CITRATE (PF) 250 MCG/5ML IJ SOLN
INTRAMUSCULAR | Status: DC | PRN
  Administered 2018-06-15: 100 ug via INTRAVENOUS

## 2018-06-15 MED ORDER — FENTANYL CITRATE (PF) 100 MCG/2ML IJ SOLN
INTRAMUSCULAR | Status: AC
  Filled 2018-06-15: qty 2

## 2018-06-15 MED ORDER — BUPIVACAINE HCL (PF) 0.25 % IJ SOLN
INTRAMUSCULAR | Status: DC | PRN
  Administered 2018-06-15: 30 mL

## 2018-06-15 MED ORDER — SUGAMMADEX SODIUM 200 MG/2ML IV SOLN
INTRAVENOUS | Status: DC | PRN
  Administered 2018-06-15: 125 mg via INTRAVENOUS

## 2018-06-15 MED ORDER — LIDOCAINE 2% (20 MG/ML) 5 ML SYRINGE
INTRAMUSCULAR | Status: DC | PRN
  Administered 2018-06-15: 60 mg via INTRAVENOUS

## 2018-06-15 MED ORDER — CEFAZOLIN SODIUM-DEXTROSE 2-4 GM/100ML-% IV SOLN
INTRAVENOUS | Status: AC
  Filled 2018-06-15: qty 100

## 2018-06-15 MED ORDER — 0.9 % SODIUM CHLORIDE (POUR BTL) OPTIME
TOPICAL | Status: DC | PRN
  Administered 2018-06-15: 1000 mL

## 2018-06-15 MED ORDER — GABAPENTIN 300 MG PO CAPS
300.0000 mg | ORAL_CAPSULE | ORAL | Status: AC
  Administered 2018-06-15: 300 mg via ORAL

## 2018-06-15 MED ORDER — GABAPENTIN 300 MG PO CAPS
ORAL_CAPSULE | ORAL | Status: AC
  Administered 2018-06-15: 300 mg via ORAL
  Filled 2018-06-15: qty 1

## 2018-06-15 MED ORDER — HYDROMORPHONE HCL 1 MG/ML IJ SOLN
0.2500 mg | INTRAMUSCULAR | Status: DC | PRN
  Administered 2018-06-15: 0.5 mg via INTRAVENOUS

## 2018-06-15 MED ORDER — PROPOFOL 10 MG/ML IV BOLUS
INTRAVENOUS | Status: DC | PRN
  Administered 2018-06-15: 150 mg via INTRAVENOUS

## 2018-06-15 MED ORDER — SODIUM CHLORIDE 0.9 % IV SOLN
Freq: Once | INTRAVENOUS | Status: AC
  Administered 2018-06-15: 15:00:00
  Filled 2018-06-15: qty 1.2

## 2018-06-15 MED ORDER — OXYCODONE HCL 5 MG PO TABS
ORAL_TABLET | ORAL | Status: AC
  Filled 2018-06-15: qty 1

## 2018-06-15 MED ORDER — SUGAMMADEX SODIUM 200 MG/2ML IV SOLN
INTRAVENOUS | Status: AC
  Filled 2018-06-15: qty 2

## 2018-06-15 MED ORDER — HYDROMORPHONE HCL 1 MG/ML IJ SOLN
INTRAMUSCULAR | Status: AC
  Filled 2018-06-15: qty 1

## 2018-06-15 MED ORDER — MIDAZOLAM HCL 2 MG/2ML IJ SOLN
INTRAMUSCULAR | Status: AC
  Filled 2018-06-15: qty 2

## 2018-06-15 MED ORDER — DEXAMETHASONE SODIUM PHOSPHATE 10 MG/ML IJ SOLN
INTRAMUSCULAR | Status: AC
  Filled 2018-06-15: qty 1

## 2018-06-15 MED ORDER — DEXAMETHASONE SODIUM PHOSPHATE 10 MG/ML IJ SOLN
INTRAMUSCULAR | Status: DC | PRN
  Administered 2018-06-15: 10 mg via INTRAVENOUS

## 2018-06-15 MED ORDER — PHENYLEPHRINE 40 MCG/ML (10ML) SYRINGE FOR IV PUSH (FOR BLOOD PRESSURE SUPPORT)
PREFILLED_SYRINGE | INTRAVENOUS | Status: AC
  Filled 2018-06-15: qty 10

## 2018-06-15 MED ORDER — OXYCODONE HCL 5 MG/5ML PO SOLN: 5.0000 mg | Freq: Once | ORAL | Status: AC | PRN

## 2018-06-15 MED ORDER — LACTATED RINGERS IV SOLN
INTRAVENOUS | Status: DC
  Administered 2018-06-15: 14:00:00 via INTRAVENOUS

## 2018-06-15 MED ORDER — ONDANSETRON HCL 4 MG/2ML IJ SOLN
INTRAMUSCULAR | Status: DC | PRN
  Administered 2018-06-15: 4 mg via INTRAVENOUS

## 2018-06-15 SURGICAL SUPPLY — 29 items
BAG DECANTER FOR FLEXI CONT (MISCELLANEOUS) ×3 IMPLANT
BLADE SURG SZ11 CARB STEEL (BLADE) ×3 IMPLANT
CHLORAPREP W/TINT 26ML (MISCELLANEOUS) ×3 IMPLANT
COVER PROBE U/S 5X48 (MISCELLANEOUS) ×3 IMPLANT
COVER SURGICAL LIGHT HANDLE (MISCELLANEOUS) ×3 IMPLANT
COVER WAND RF STERILE (DRAPES) ×3 IMPLANT
DECANTER SPIKE VIAL GLASS SM (MISCELLANEOUS) ×3 IMPLANT
DRAPE C-ARM 42X120 X-RAY (DRAPES) ×3 IMPLANT
DRAPE LAPAROTOMY T 98X78 PEDS (DRAPES) ×3 IMPLANT
DRSG TEGADERM 2-3/8X2-3/4 SM (GAUZE/BANDAGES/DRESSINGS) ×3 IMPLANT
DRSG TEGADERM 4X4.75 (GAUZE/BANDAGES/DRESSINGS) ×3 IMPLANT
ELECT PENCIL ROCKER SW 15FT (MISCELLANEOUS) ×3 IMPLANT
ELECT REM PT RETURN 15FT ADLT (MISCELLANEOUS) ×3 IMPLANT
GAUZE 4X4 16PLY RFD (DISPOSABLE) ×3 IMPLANT
GAUZE SPONGE 2X2 8PLY STRL LF (GAUZE/BANDAGES/DRESSINGS) ×2 IMPLANT
GLOVE ECLIPSE 8.0 STRL XLNG CF (GLOVE) ×3 IMPLANT
GLOVE INDICATOR 8.0 STRL GRN (GLOVE) ×3 IMPLANT
GOWN STRL REUS W/TWL XL LVL3 (GOWN DISPOSABLE) ×6 IMPLANT
KIT BASIN OR (CUSTOM PROCEDURE TRAY) ×3 IMPLANT
KIT PORT POWER 8FR ISP CVUE (Port) ×3 IMPLANT
NEEDLE HYPO 22GX1.5 SAFETY (NEEDLE) ×3 IMPLANT
PACK BASIC VI WITH GOWN DISP (CUSTOM PROCEDURE TRAY) ×3 IMPLANT
SPONGE GAUZE 2X2 STER 10/PKG (GAUZE/BANDAGES/DRESSINGS) ×4
SUT MNCRL AB 4-0 PS2 18 (SUTURE) ×3 IMPLANT
SUT PROLENE 2 0 SH DA (SUTURE) ×6 IMPLANT
SYR 10ML LL (SYRINGE) ×3 IMPLANT
SYR 20CC LL (SYRINGE) ×3 IMPLANT
TOWEL OR 17X26 10 PK STRL BLUE (TOWEL DISPOSABLE) ×3 IMPLANT
TOWEL OR NON WOVEN STRL DISP B (DISPOSABLE) ×3 IMPLANT

## 2018-06-15 NOTE — Transfer of Care (Signed)
Immediate Anesthesia Transfer of Care Note  Patient: Stacey Simpson  Procedure(s) Performed: INSERTION PORT-A-CATH UNDER ULTRASOUND AND FLUROSCOPIC GUIDANCE (Left )  Patient Location: PACU  Anesthesia Type:General  Level of Consciousness: awake, alert  and oriented  Airway & Oxygen Therapy: Patient Spontanous Breathing and Patient connected to face mask oxygen  Post-op Assessment: Report given to RN and Post -op Vital signs reviewed and stable  Post vital signs: Reviewed and stable  Last Vitals:  Vitals Value Taken Time  BP 128/96 06/15/2018  3:45 PM  Temp    Pulse 78 06/15/2018  3:47 PM  Resp 18 06/15/2018  3:47 PM  SpO2 100 % 06/15/2018  3:47 PM  Vitals shown include unvalidated device data.  Last Pain:  Vitals:   06/15/18 1331  TempSrc: Oral         Complications: No apparent anesthesia complications

## 2018-06-15 NOTE — Anesthesia Preprocedure Evaluation (Addendum)
Anesthesia Evaluation  Patient identified by MRN, date of birth, ID band Patient awake    Reviewed: Allergy & Precautions, NPO status , Patient's Chart, lab work & pertinent test results  Airway Mallampati: I  TM Distance: >3 FB Neck ROM: Full    Dental  (+) Missing, Chipped,    Pulmonary Current Smoker,    Pulmonary exam normal breath sounds clear to auscultation       Cardiovascular negative cardio ROS Normal cardiovascular exam Rhythm:Regular Rate:Normal     Neuro/Psych  Headaches, PSYCHIATRIC DISORDERS Anxiety Depression    GI/Hepatic Neg liver ROS, Crohn's disease   Endo/Other  negative endocrine ROS  Renal/GU negative Renal ROS     Musculoskeletal   Abdominal   Peds  Hematology Lyme disease   Anesthesia Other Findings Short gut syndrome Need for chemotherapy  Reproductive/Obstetrics                             Anesthesia Physical Anesthesia Plan  ASA: III  Anesthesia Plan: General   Post-op Pain Management:    Induction: Intravenous  PONV Risk Score and Plan: 2 and Midazolam, Dexamethasone, Ondansetron and Treatment may vary due to age or medical condition  Airway Management Planned: Oral ETT  Additional Equipment:   Intra-op Plan:   Post-operative Plan: Extubation in OR  Informed Consent: I have reviewed the patients History and Physical, chart, labs and discussed the procedure including the risks, benefits and alternatives for the proposed anesthesia with the patient or authorized representative who has indicated his/her understanding and acceptance.   Dental advisory given  Plan Discussed with: CRNA  Anesthesia Plan Comments:         Anesthesia Quick Evaluation

## 2018-06-15 NOTE — Anesthesia Procedure Notes (Signed)
Procedure Name: Intubation Date/Time: 06/15/2018 2:52 PM Performed by: Glory Buff, CRNA Pre-anesthesia Checklist: Patient identified, Emergency Drugs available, Suction available and Patient being monitored Patient Re-evaluated:Patient Re-evaluated prior to induction Oxygen Delivery Method: Circle system utilized Preoxygenation: Pre-oxygenation with 100% oxygen Induction Type: IV induction Ventilation: Mask ventilation without difficulty Laryngoscope Size: Miller and 3 Grade View: Grade I Tube type: Oral Tube size: 7.0 mm Number of attempts: 1 Airway Equipment and Method: Stylet and Oral airway Placement Confirmation: ETT inserted through vocal cords under direct vision,  positive ETCO2 and breath sounds checked- equal and bilateral Secured at: 20 cm Tube secured with: Tape Dental Injury: Teeth and Oropharynx as per pre-operative assessment

## 2018-06-15 NOTE — Op Note (Signed)
06/15/2018  3:36 PM  PATIENT:  Stacey Simpson  42 y.o. female  Patient Care Team: [name], NP as PCP - General (Nurse Practitioner) Dorrene German Shelda Altes., MD as Consulting Physician (Gastroenterology) Jackquline Denmark, MD as Consulting Physician (Gastroenterology) Pieter Partridge, DO as Consulting Physician (Neurology) Raelyn Number, MD as Referring Physician (Internal Medicine) Eduard Roux, MD as Consulting Physician (Gastroenterology) Thelma Comp, MD as Referring Physician (Gastroenterology) Shela Commons Ang as Consulting Physician (General Surgery) Brayton Layman, MD as Referring Physician (Gastroenterology) Ellard Artis, MD as Referring Physician (Psychiatry)  PRE-OPERATIVE DIAGNOSIS:  short gut syndrome, port needed for chemo  POST-OPERATIVE DIAGNOSIS:  short gut syndrome, port needed for chemo  PROCEDURE:  Procedure(s): INSERTION PORT-A-CATH UNDER ULTRASOUND AND FLUROSCOPIC GUIDANCE  SURGEON:  [name], MD  ASSISTANT: Carlena Hurl, PA-C  ANESTHESIA:   local and general  EBL:  No intake/output data recorded.  Delay start of Pharmacological VTE agent (>24hrs) due to surgical blood loss or risk of bleeding:  no  DRAINS: none   SPECIMEN:  No Specimen  DISPOSITION OF SPECIMEN:  N/A  COUNTS:  YES  PLAN OF CARE: Discharge to home after PACU  PATIENT DISPOSITION:  PACU - hemodynamically stable.  INDICATION: Patient with need for IV therapy.  TNA per her gastroenterologist.  Port-A-Cath placement was requested.  Use of a central venous catheter for intravenous therapy was discussed.  Technique of catheter placement using ultrasound and fluoroscopy guidance was discussed.  Risks such as bleeding, infection, pneumothorax, catheter occlusion, reoperation, and other risks were discussed.   I noted a good likelihood this will help address the problem.  Questions were answered.  The patient expressed understanding & wishes to proceed.  OR FINDINGS:  Normal-appearing anatomy.  Is an 8 [place] ClearVue power port. It goes through the right internal jugular vein  DESCRIPTION:   Procedure: Informed consent was confirmed. Patient was brought the operating room. and positioned supine. Arms were tucked. The patient underwent deep sedation. Neck and chest were clipped and prepped and draped in a sterile fashion. A surgical timeout confirmed our plan.  I placed a field block of local anesthesia on the neck & chest  I used the ultrasound to identify the right internal jugular vein. I entered into it using on the first venipuncture under direct ultrasound guidance. Wire was passed into the inferior vena cava by fluoroscopy.  I made an incision in the lateral infraclavicular pocket. Made a subcutaneous pocket. I tunneled the power port from the chest wound to the neck puncture site. The port was secured to the left anterior chest wall using 2-0 Prolene interrupted stitches x4. Catheter flushed well.  I used a dilator on the wire using Seldinger technique to dilate the neck tract. Then I used a dilator with a peel away sheath.  We used fluoroscopy.  I pulled the wire back into the right atrial/superior vena cava junction.  I pulled the wire back until it was at the neck puncture site. This gave the true measurement of the intravenous segment. I cut the catheter to appropriate length. I removed the wire and dilator. The catheter was placed within the sheath. The sheath was peeled away.  Fluoroscopy confirmed the tip in the distal SVC.  Catheter aspirated and flushed well. On final fluoro reevaluation the tip seen to be in good position in the distal SVC/RA junction..    I closed the wounds using 4 Monocryl stitch & placed sterile dressings. Patient should go home  later today. Catheter is okay to use.  [name], M.D., F.A.C.S. Gastrointestinal and Minimally Invasive Surgery [facility] [address] [place], [place]  [phone] Main / Paging

## 2018-06-15 NOTE — Anesthesia Postprocedure Evaluation (Signed)
Anesthesia Post Note  Patient: Stacey Simpson  Procedure(s) Performed: INSERTION PORT-A-CATH UNDER ULTRASOUND AND FLUROSCOPIC GUIDANCE (Left )     Patient location during evaluation: PACU Anesthesia Type: General Level of consciousness: awake and alert Pain management: pain level controlled Vital Signs Assessment: post-procedure vital signs reviewed and stable Respiratory status: spontaneous breathing, nonlabored ventilation, respiratory function stable and patient connected to nasal cannula oxygen Cardiovascular status: blood pressure returned to baseline and stable Postop Assessment: no apparent nausea or vomiting Anesthetic complications: no    Last Vitals:  Vitals:   06/15/18 1630 06/15/18 1650  BP:  119/90  Pulse:  85  Resp:  20  Temp: 36.7 C 36.6 C  SpO2:  96%    Last Pain:  Vitals:   06/15/18 1650  TempSrc:   PainSc: 6                  Grete Bosko P Ulla Mckiernan

## 2018-06-15 NOTE — Discharge Instructions (Signed)
PORT-A-CATHETER PLACEMENT:  INSTRUCTIONS AFTER SURGERY  DIET: Follow a light bland diet the first night after arrival home, such as soup, liquids, crackers, etc.  Be sure to include lots of fluids daily.  Avoid fast food or heavy meals as your are more likely to get nauseated.    Take your usually prescribed home medications unless otherwise directed.  PAIN CONTROL: Pain is best controlled by a usual combination of three different methods TOGETHER: Ice/Heat Over the counter acetaminophen pain medication Prescription pain medication  Most patients will experience some swelling and bruising around the incisions.  Ice packs or heating pads (30-60 minutes up to 6 times a day) will help. Use ice for the first few days to help decrease swelling and bruising, then switch to heat to help relax tight/sore spots and speed recovery.  Some people prefer to use ice alone, heat alone, alternating between ice & heat.  Experiment to what works for you.  Swelling and bruising can take several weeks to resolve.    It is helpful to take an over-the-counter pain medication regularly for the first few weeks.  Using acetaminophen (Tylenol, etc) 500-667m four times a day (every meal & bedtime) is usually safest since NSAIDs like ibuprofen are not advisable in patients with kidney disease.  A  prescription for pain medication (such as oxycodone, hydrocodone, etc) may be given to you upon discharge.  Take your pain medication as prescribed.   If you are having problems/concerns with the prescription medicine (does not control pain, nausea, vomiting, rash, itching, etc), please call [phone] see if we need to switch you to a different pain medicine that will work better for you and/or control your side effect better. If you need a refill on your pain medication, please contact your pharmacy.  They will contact our office to request authorization. Prescriptions will not be filled after 5 pm or on  week-ends.  Avoid getting constipated.   Between the surgery and the pain medications, it is common to experience some constipation.  Increasing fluid intake and taking a fiber supplement (such as Metamucil, Citrucel, FiberCon, MiraLax, etc) 1-2 times a day regularly will usually help prevent this problem from occurring.  A mild laxative (prune juice, Milk of Magnesia, MiraLax, etc) should be taken according to package directions if there are no bowel movements after 48 hours.    Wash / shower every day.   You may shower over the dressings as they are waterproof.  Continue to shower over incision(s) after the dressing is off.  The Chemotherapy / Oncology nurse will remove your waterproof bandages in their office a few days after surgery.  Do not remove the bandages unless you are 5 days out from surgery  ACTIVITIES as tolerated:   You may resume regular (light) daily activities beginning the next day--such as daily self-care, walking, climbing stairs--gradually increasing activities as tolerated.  If you can walk 30 minutes without difficulty, it is safe to try more intense activity such as jogging, treadmill, bicycling, low-impact aerobics, swimming, etc. Save the most intensive and strenuous activity for last such as push-ups, heavy lifting, contact sports, etc  Refrain from any heavy lifting or straining until you are off narcotics for pain control.    DO NOT PUSH THROUGH PAIN.  Let pain be your guide: If it hurts to do something, don't do it.  Pain is your body warning you to avoid that activity for another week until the pain goes down. You may drive  when you are no longer taking prescription pain medication, you can comfortably wear a seatbelt, and you can safely maneuver your car and apply brakes. You may have sexual intercourse when it is comfortable.   FOLLOW UP with the Oncology / Chemotherapy nurses closely after surgery. Please call [facility] at [phone] only as needed.  The infusion  nurses & Oncology usually follow you closely, making the need for follow-up in our office redundant and therefore not needed.  If they or you have concerns, please call us for possible follow-up in our office 9. IF YOU HAVE DISABILITY OR FAMILY LEAVE FORMS, BRING THEM TO THE OFFICE FOR PROCESSING.  DO NOT GIVE THEM TO YOUR DOCTOR.   WHEN TO CALL us [phone]: Poor pain control Reactions / problems with new medications (rash/itching, nausea, etc)  Fever over 101.5 F (38.5 C) Worsening swelling or bruising Continued bleeding from incision. Increased pain, redness, or drainage from the incision   The clinic staff is available to answer your questions during regular business hours (8:30am-5pm).  Please dont hesitate to call and ask to speak to one of our nurses for clinical concerns.   If you have a medical emergency, go to the nearest emergency room or call 911.  A surgeon from [facility] is always on call at the [facility], [place], [place], [place], [place]  [id number] ? MAIN: [phone] ? TOLL FREE: [phone] ?  FAX [phone] [email or web address]
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
## 2018-06-16 ENCOUNTER — Encounter (HOSPITAL_COMMUNITY): Payer: Self-pay | Admitting: Surgery

## 2018-06-20 ENCOUNTER — Ambulatory Visit (INDEPENDENT_AMBULATORY_CARE_PROVIDER_SITE_OTHER): Payer: Managed Care, Other (non HMO) | Admitting: Gastroenterology

## 2018-06-20 ENCOUNTER — Encounter: Payer: Self-pay | Admitting: Gastroenterology

## 2018-06-20 VITALS — BP 118/78 | HR 84 | Ht 66.0 in | Wt 136.4 lb

## 2018-06-20 DIAGNOSIS — K912 Postsurgical malabsorption, not elsewhere classified: Secondary | ICD-10-CM

## 2018-06-20 DIAGNOSIS — K50919 Crohn's disease, unspecified, with unspecified complications: Secondary | ICD-10-CM

## 2018-06-20 MED ORDER — PROMETHAZINE HCL 25 MG PO TABS: 25.0000 mg | ORAL_TABLET | Freq: Three times a day (TID) | ORAL | 2 refills | Status: AC | PRN

## 2018-06-20 NOTE — Patient Instructions (Addendum)
If you are age 44 or older, your body mass index should be between 23-30. Your Body mass index is 22.01 kg/m. If this is out of the aforementioned range listed, please consider follow up with your Primary Care Provider.  If you are age 60 or younger, your body mass index should be between 19-25. Your Body mass index is 22.01 kg/m. If this is out of the aformentioned range listed, please consider follow up with your Primary Care Provider.   Your provider has recommended that you see [facility] for infectious disease. There office will contact you with your appointment.   [name] will set up you IV fluids.   Please make a follow up appointment with Dr. Koleen Distance.   We have sent the following medications to your pharmacy for you to pick up at your convenience: Phenergan 25 mg every 8 hours as needed.    Please go to the following web site to learn more about Gattex:  [email or web address]  Thank you,  Dr. Jackquline Denmark

## 2018-06-20 NOTE — Progress Notes (Signed)
Chief Complaint: FU   Referring Provider:  [name], NP      ASSESSMENT AND PLAN;   #1.  Short gut syndrome, s/p port placement 06/15/2018 #2.  Crohn's disease: Dx at age 42,([place], [place]), s/p R hemicolectomy with TI resection at age 68, S/P left colonic resection due to abscess with colostomy at [facility], failed Humira, Cimzia, Remicade and most recently Entyvio.  Has been to multiple gastroenterologists at multiple centers.  Multiple admissions.  Most recently at [facility] transferred to [facility]. S/P neg EGD and colon (Dr Dorrene German) 02/21/2018 (Nl EGD, colon normal neo-TI and colon, neg Bx). CT 02/2018: skip lesions but minimal inflammation. #3.  Cyclical nausea/vomiting. Neg EGD, CT neg for SBO. #4.  Comorbid conditions including Lyme disease, anxiety/depression.  Plan: - Records from [name] - She has been restarted on doxycycline due to acute lymes disease. Recommend ID FU appt with Dr. Mathis Fare (we will arrange). - Initiate home IVF 3 times a week.  This will be arranged by [name] FNP. - Quit smoking. - Info about Antony Madura was given. - Continue Phenergan 26m po q8hrs prn N/V - [facility] (patient is established patient, she will call for appointment) - FU 12 weeks. No indication for steroids at the present time since last EGD and colonoscopy did not show any significant inflammation.  However, if the C-reactive protein is high or there are any other signs of inflammation, we will reconsider. - Continue using Lomotil and cholestyramine - Please obtain previous records. [facility]  HPI:    Stacey MOYNAHANis a 42y.o. female  Very complicated unfortunate patient Has been to multiple gastroenterologists at multiple centers.  with history of Crohn's disease- Dx at age 42,([place], [place]), status post right hemicolectomy with TI resection at the age 42 status post left colonic [facility] due to abscess [facility]  [facility] failed Humira, Cimzia, Remicade and most recently Entyvio.  Has been to multiple gastroenterologists at multiple centers.  Multiple admissions.  [facility] and colon (Dr RDorrene German 02/21/2018 (Nl EGD, colon normal neo-TI and colon, neg Bx). CT 02/2018: skip lesions but minimal inflammation.  Restarted on doxycycline few days ago due to positive Lyme's disease again.  Currently having more nausea/vomiting.  She is status post port placement. Her most recent lab tests did reveal normal electrolytes.  Still having diarrhea and has to change colostomy bag 3-4 times per day.  The diarrhea is somewhat better though.   Past Medical History:  Diagnosis Date  . Anxiety   . [place]'s disease ([place]   . Herpes   . Hypokalemia   . Insomnia   . Lyme disease   . Major depressive disorder   . Migraines   . Short bowel syndrome   . Tachycardia   . Vitamin B12 deficiency     Past Surgical History:  Procedure Laterality Date  . ABDOMINAL HYSTERECTOMY    . APPENDECTOMY  1990s  . [place]OSTOMY  2017   [place] . PORTACATH PLACEMENT Left 06/15/2018   Procedure: INSERTION PORT-A-CATH UNDER ULTRASOUND AND FLUROSCOPIC GUIDANCE;  Surgeon: GMichael Boston MD;  Location: [facility];  Service: General;  Laterality: Left;  . RIGHT COLECTOMY  2015   Dr WCarrington Clamp  Resection of ileocolonic anastomotic stricture.     Family History  Adopted: Yes  Family history unknown: Yes    Social History   Tobacco Use  . Smoking status: Current Every Day Smoker    Packs/day: 0.25    Types: Cigarettes  . Smokeless tobacco: Never Used  . Tobacco comment: working on quiting  Substance Use Topics  . Alcohol use: No  . Drug use: No    Current Outpatient Medications  Medication Sig Dispense Refill  . clonazePAM (KLONOPIN) 0.5 MG tablet Take 0.5 mg by mouth 2 (two) times daily as needed for anxiety.     .  diphenhydrAMINE (BENADRYL) 25 MG tablet Take 25 mg by mouth daily as needed for allergies.    Marland Kitchen doxycycline (VIBRA-TABS) 100 MG tablet Take 100 mg by mouth 2 (two) times daily.  0  . fluticasone (FLONASE) 50 MCG/ACT nasal spray Place 1 spray into both nostrils daily as needed for allergies or rhinitis.    . promethazine (PHENERGAN) 25 MG tablet Take 25 mg by mouth every 12 (twelve) hours as needed for nausea or vomiting.     . sertraline (ZOLOFT) 100 MG tablet Take 100 mg by mouth daily at 12 noon.   2  . SUMAtriptan (IMITREX) 25 MG tablet Take 25 mg by mouth as needed for migraine.     . traZODone (DESYREL) 100 MG tablet Take 50 mg by mouth at bedtime.    . vortioxetine HBr (TRINTELLIX) 20 MG TABS Take 20 mg by mouth daily at 12 noon.      No current facility-administered medications for this visit.     No Known Allergies  Review of Systems:  Constitutional: Denies fever, chills, diaphoresis, appetite change and fatigue.  HEENT: Denies photophobia, eye pain, redness, hearing loss, ear pain, congestion, sore throat, rhinorrhea, sneezing, mouth sores, neck pain, neck stiffness and tinnitus.   Respiratory: Denies SOB, DOE, cough, chest tightness,  and wheezing.   Cardiovascular: Denies chest pain, palpitations and leg swelling.  Genitourinary: Denies dysuria, urgency, frequency, hematuria, flank pain and difficulty urinating.  Musculoskeletal: Denies myalgias, back pain, joint swelling, arthralgias and gait problem.  Skin: No rash.  Neurological: Has dizziness. No seizures, syncope, weakness, light-headedness, numbness and headaches.  Hematological: Denies adenopathy. Easy bruising, personal or family bleeding history  Psychiatric/Behavioral: has anxiety or depression     Physical Exam:    BP 118/78   Pulse 84   Ht 5' 6"  (1.676 m)   Wt 136 lb 6 oz (61.9 kg)   SpO2 97%   BMI 22.01 kg/m  Filed Weights   06/20/18 1413  Weight: 136 lb 6 oz (61.9 kg)   Constitutional:   Well-developed, in no acute distress. Psychiatric: Normal mood and affect. Behavior is normal. HEENT: Pupils normal.  Conjunctivae are normal. No scleral icterus. Neck supple.  Cardiovascular: Normal rate, regular rhythm. No edema Pulmonary/chest: Effort normal and breath sounds normal. No wheezing, rales or rhonchi. Abdominal: Soft, nondistended. Nontender. Bowel sounds active throughout. There are no masses palpable. No hepatomegaly. Has ostomy Rectal:  defered Neurological: Alert and oriented to person place and time. Skin: Skin is warm and dry. No rashes noted. Discussed above in detail with the patient and patient's husband.  Carmell Austria, MD 06/20/2018, 2:38 PM  Cc: [name], NP

## 2018-06-21 ENCOUNTER — Telehealth: Payer: Self-pay | Admitting: Gastroenterology

## 2018-06-21 NOTE — Telephone Encounter (Signed)
Left message for PCP to call back.

## 2018-06-21 NOTE — Telephone Encounter (Signed)
PCP returned call, pls call them again.

## 2018-06-21 NOTE — Telephone Encounter (Signed)
PCP called asking the frequency of blood work that pt needs and the type of draw with TPN that Dr. Lyndel Safe wants. Pls call PCP

## 2018-06-21 NOTE — Telephone Encounter (Signed)
Returned call to PCP, state Jonelle Sidle is on the phone and they will call back.  Received call from [facility] internal medicine. They need to know the following:  The frequency of labs Dr. Lyndel Safe wants drawn? What labs he wants drawn? Is Dr. Lyndel Safe going to be managing the Eduard Clos NP does not have experience to do this.  Please advise.

## 2018-06-22 NOTE — Telephone Encounter (Signed)
Stacey Simpson aware of orders and is setting pt up for fluids/labs.

## 2018-06-22 NOTE — Telephone Encounter (Signed)
Left message for Jonelle Sidle to call back.

## 2018-06-22 NOTE — Telephone Encounter (Signed)
No TPN for now.  We need to arrange for home health care to give IV fluids normal saline 1 L with 40 mEq of potassium over 3 hours.  Can use port.  Give IV fluids 3 times a week. X 3 months for now  Labs-check CBC, CMP and magnesium in 2 weeks and then in 4 weeks.  Patient is to follow-up in 12 weeks here

## 2018-06-29 ENCOUNTER — Telehealth: Payer: Self-pay | Admitting: Gastroenterology

## 2018-06-30 NOTE — Telephone Encounter (Signed)
Dr. Flonnie Hailstone office calling again regarding pt. They need to know the reason why pt was referred and also ov notes. Appt is tomorrow morning.

## 2018-06-30 NOTE — Telephone Encounter (Signed)
I have faxed information to the number provided.

## 2018-07-11 ENCOUNTER — Telehealth: Payer: Self-pay | Admitting: Gastroenterology

## 2018-07-11 NOTE — Telephone Encounter (Signed)
She had seen Dr. Renee Harder in the past.  He is the IBD specialist at [facility] and a well-known authority on IBD. Do not know whom else she saw. I did not see his note in care everywhere.  But would follow-up on his note. Patient should have appointment with me in 12 weeks. Thanks for letting us know.

## 2018-07-12 NOTE — Telephone Encounter (Signed)
Left message for patient to call back  

## 2018-07-13 NOTE — Telephone Encounter (Signed)
Left message for patient to call back  

## 2018-07-18 NOTE — Telephone Encounter (Signed)
Left message for patient to call back  

## 2018-07-18 NOTE — Telephone Encounter (Signed)
-   Needs to FU with Dr Casey Burkitt [facility]). She has seen him in past. - Hold off on steroids for now. - For Port-A-Cath, usually there is a protocol to flush.  Can you please find out from [facility] should have one)?  Otherwise, please get protocol from surgeon's [facility] surgery) office.  Usually they take care of it as they insert Port-A-Caths.

## 2018-07-18 NOTE — Telephone Encounter (Signed)
Left message for patient to call back if questions/concerns arise=message contained information regarding port-a-cath [facility] will contact the patient concerning having the port-a-cath flushed; patient also advised to "hold off on the steroid" per MD recommendations; information will be routed to Dr. Renee Harder as requested by MD;

## 2018-07-18 NOTE — Telephone Encounter (Addendum)
Called and spoke with patient - patient reports she was seen by Dr. Koleen Distance instead of [facility]; patient is requesting to be referred to the correct MD and also requesting to be seen in the office to discuss treatment plan; an appt was made for this patient on 07/26/18 at 11:00 am with arrival at 10:45 am; patient verbalized understanding of instructions/information;  When patient was seen by Dr. Koleen Distance -she was told he was not the one that was supposed to see her and then prescribed her a Prednisone taper dose-1)does she need to take this medication (as she has not started) for her crohn's flare up or not; 2)she needs an order placed into Epic for her to have a port-a-cath flush (to be done at [facility] ? since she had it placed there-it has been almost 5 weeks since it was placed and has not been flushed) Also-would you like me to ensure the referral is resent to Dr. Renee Harder (as the patient remembers his face (maybe from being seen by him in the past and from the [place] search she did) Please advise;

## 2018-07-19 NOTE — Telephone Encounter (Signed)
Called and spoke with patient- informed patient of MD recommendations concerning holding off on medication (Prednisone) prescribed by alternate MD; patient also advised that [facility] would be contacting her to schedule the port-a-cath flush; patient advised to call Dr. Seward Meth office and attempt to schedule an appt since she has been seen previously - to call office back if she is unable to get an appt made and the office will initiate referral paperwork; patient verbalized understanding of information/instructions; patient also advised to call back if questions/concerns arise;

## 2018-07-21 ENCOUNTER — Other Ambulatory Visit: Payer: Self-pay

## 2018-07-21 ENCOUNTER — Telehealth: Payer: Self-pay | Admitting: Gastroenterology

## 2018-07-21 DIAGNOSIS — R197 Diarrhea, unspecified: Secondary | ICD-10-CM

## 2018-07-21 DIAGNOSIS — E46 Unspecified protein-calorie malnutrition: Secondary | ICD-10-CM

## 2018-07-21 DIAGNOSIS — K912 Postsurgical malabsorption, not elsewhere classified: Secondary | ICD-10-CM

## 2018-07-21 DIAGNOSIS — K90829 Short bowel syndrome, unspecified: Secondary | ICD-10-CM

## 2018-07-21 DIAGNOSIS — K50919 Crohn's disease, unspecified, with unspecified complications: Secondary | ICD-10-CM

## 2018-07-21 MED ORDER — AMBULATORY NON FORMULARY MEDICATION: 1000.0000 mL | 0 refills | Status: DC

## 2018-07-21 NOTE — Telephone Encounter (Signed)
Stacey Simpson/Stacey Simpson,  Please assist me in figuring out what needs to be done for this patient to have her port-a-cath flushed, as [facility] has instructed they only placed the port and have no responsibility to manage her care;

## 2018-07-21 NOTE — Telephone Encounter (Signed)
Nothing else needs to be done-it is completely handled-the [facility] flush and lab work; patient has been advised to call back if questions concerns arise;

## 2018-07-21 NOTE — Telephone Encounter (Addendum)
Verbally clarified order from Dr. Lyndel Safe: Port-a-cath to be flushed today if [name] is able to see patient, also please have CBC,CMP, and Magnesium lab work completed at this visit;  Patient is to have IV NS with 40 meq Potassium x 1 liter infused over 3 hours via port; patient is to have infusions 3 times per week for 3 months; patient is to have lab work drawn ever 4 weeks while receiving infusions at the [facility] [name]; Patient notified of plan of care and is agreeable to these recommendations; patient advised to call [phone] to schedule today's appt and future appts for infusions; patient is to follow up in office in 12 weeks time; patient advised to call if questions/concerns arise;  [name]- can you please ensure the lab work and follow up visit are placed in [place];   Thank you

## 2018-07-21 NOTE — Telephone Encounter (Signed)
Printout of orders and Rx faxed to [facility] [place] [phone] for patient care;

## 2018-07-21 NOTE — Telephone Encounter (Signed)
What do you need me to do?

## 2018-07-21 NOTE — Telephone Encounter (Signed)
Referral needs to be sent to [facility]. Let me know if there is anything else I can do to help you.

## 2018-07-21 NOTE — Progress Notes (Signed)
Home health referral

## 2018-07-22 ENCOUNTER — Ambulatory Visit (HOSPITAL_COMMUNITY)
Admission: RE | Admit: 2018-07-22 | Discharge: 2018-07-22 | Disposition: A | Discharge status: Home / Self Care | Payer: Managed Care, Other (non HMO) | Source: Ambulatory Visit | Attending: Gastroenterology | Admitting: Gastroenterology

## 2018-07-22 DIAGNOSIS — K912 Postsurgical malabsorption, not elsewhere classified: Secondary | ICD-10-CM | POA: Diagnosis present

## 2018-07-22 LAB — CBC WITH DIFFERENTIAL/PLATELET
ABS IMMATURE GRANULOCYTES: 0.05 10*3/uL (ref 0.00–0.07)
BASOS ABS: 0.1 10*3/uL (ref 0.0–0.1)
BASOS PCT: 0 %
EOS ABS: 0.1 10*3/uL (ref 0.0–0.5)
Eosinophils Relative: 1 %
HCT: 44.6 % (ref 36.0–46.0)
Hemoglobin: 14.2 g/dL (ref 12.0–15.0)
IMMATURE GRANULOCYTES: 0 %
LYMPHS ABS: 4 10*3/uL (ref 0.7–4.0)
Lymphocytes Relative: 29 %
MCH: 28.7 pg (ref 26.0–34.0)
MCHC: 31.8 g/dL (ref 30.0–36.0)
MCV: 90.3 fL (ref 80.0–100.0)
MONOS PCT: 4 %
Monocytes Absolute: 0.6 10*3/uL (ref 0.1–1.0)
NEUTROS ABS: 8.8 10*3/uL — AB (ref 1.7–7.7)
NEUTROS PCT: 66 %
NRBC: 0 % (ref 0.0–0.2)
PLATELETS: 523 10*3/uL — AB (ref 150–400)
RBC: 4.94 MIL/uL (ref 3.87–5.11)
RDW: 13.4 % (ref 11.5–15.5)
WBC: 13.6 10*3/uL — ABNORMAL HIGH (ref 4.0–10.5)

## 2018-07-22 LAB — COMPREHENSIVE METABOLIC PANEL
ALT: 12 U/L (ref 0–44)
AST: 17 U/L (ref 15–41)
Albumin: 4.4 g/dL (ref 3.5–5.0)
Alkaline Phosphatase: 122 U/L (ref 38–126)
Anion gap: 11 (ref 5–15)
BUN: 9 mg/dL (ref 6–20)
CHLORIDE: 103 mmol/L (ref 98–111)
CO2: 23 mmol/L (ref 22–32)
CREATININE: 1.21 mg/dL — AB (ref 0.44–1.00)
Calcium: 9.4 mg/dL (ref 8.9–10.3)
GFR calc Af Amer: >60 mL/min (ref >60)
GFR calc non Af Amer: 54 mL/min — ABNORMAL LOW (ref >60)
GLUCOSE: 112 mg/dL — AB (ref 70–99)
POTASSIUM: 3.4 mmol/L — AB (ref 3.5–5.1)
SODIUM: 137 mmol/L (ref 135–145)
Total Bilirubin: 0.6 mg/dL (ref 0.3–1.2)
Total Protein: 8.2 g/dL — ABNORMAL HIGH (ref 6.5–8.1)

## 2018-07-22 LAB — MAGNESIUM: MAGNESIUM: 2.3 mg/dL (ref 1.7–2.4)

## 2018-07-22 MED ORDER — SODIUM CHLORIDE 0.9% FLUSH: 10.0000 mL | INTRAVENOUS | Status: DC | PRN

## 2018-07-22 MED ORDER — LIDOCAINE-PRILOCAINE 2.5-2.5 % EX CREA
TOPICAL_CREAM | Freq: Once | CUTANEOUS | Status: AC
  Administered 2018-07-22: 12:00:00 via TOPICAL
  Filled 2018-07-22: qty 5

## 2018-07-22 MED ORDER — HEPARIN SOD (PORK) LOCK FLUSH 100 UNIT/ML IV SOLN
500.0000 [IU] | INTRAVENOUS | Status: DC | PRN
  Filled 2018-07-22: qty 5

## 2018-07-22 NOTE — Progress Notes (Signed)
Port access attempted times two.  IV team consulted.  Landmarks difficult to ascertain via palpation.  Patient stated that she has been having pain to the site, especially with movement of arm and neck.  Checked post procedure chest xray and orientation of the port appears different from visual imprint visible and palpable on chest wall at this time.  No further attempts made to access port.  Spoke with Dr. Johney Maine' office and they advised to contact Dr. Lyndel Safe for further orders.  They recommended possible use of a port study by interventional radiology to evaluate position and patency of port. Message left for Dr. Steve Rattler office to call day hospital.  Patient contact number is [phone].  Lab work drawn by phlebotomist by venipuncture and sent to lab.

## 2018-07-25 ENCOUNTER — Encounter (HOSPITAL_COMMUNITY): Payer: Managed Care, Other (non HMO)

## 2018-07-26 ENCOUNTER — Other Ambulatory Visit (INDEPENDENT_AMBULATORY_CARE_PROVIDER_SITE_OTHER): Payer: Managed Care, Other (non HMO)

## 2018-07-26 ENCOUNTER — Ambulatory Visit (INDEPENDENT_AMBULATORY_CARE_PROVIDER_SITE_OTHER): Payer: Managed Care, Other (non HMO) | Admitting: Gastroenterology

## 2018-07-26 ENCOUNTER — Encounter: Payer: Self-pay | Admitting: Gastroenterology

## 2018-07-26 VITALS — BP 104/70 | HR 81 | Ht 66.0 in | Wt 143.2 lb

## 2018-07-26 DIAGNOSIS — R197 Diarrhea, unspecified: Secondary | ICD-10-CM

## 2018-07-26 DIAGNOSIS — K912 Postsurgical malabsorption, not elsewhere classified: Secondary | ICD-10-CM

## 2018-07-26 DIAGNOSIS — K50919 Crohn's disease, unspecified, with unspecified complications: Secondary | ICD-10-CM | POA: Diagnosis not present

## 2018-07-26 DIAGNOSIS — E46 Unspecified protein-calorie malnutrition: Secondary | ICD-10-CM

## 2018-07-26 MED ORDER — POTASSIUM CHLORIDE ER 20 MEQ PO TBCR: 20.0000 meq | EXTENDED_RELEASE_TABLET | Freq: Every day | ORAL | 3 refills | Status: DC

## 2018-07-26 NOTE — Patient Instructions (Addendum)
If you are age 44 or older, your body mass index should be between 23-30. Your Body mass index is 23.12 kg/m. If this is out of the aforementioned range listed, please consider follow up with your Primary Care Provider.  If you are age 59 or younger, your body mass index should be between 19-25. Your Body mass index is 23.12 kg/m. If this is out of the aformentioned range listed, please consider follow up with your Primary Care Provider.   We have sent the following medications to your pharmacy for you to pick up at your convenience: Potassium  We will contact you with an appointment for Interventional Radiology to ceck your port and have it flushed.  Please go to the lab on the 2nd floor [address] for your lab work on your scheduled appointment.   The nurse from [place] will be contacting you for more information.   Thank you,  Dr. Jackquline Denmark

## 2018-07-26 NOTE — Progress Notes (Signed)
Chief Complaint: FU   Referring Provider:  Jackquline Denmark, MD      ASSESSMENT AND PLAN;   #1.  Short gut syndrome. S/P port placement -unfortunately not able to use it. #2.  Crohn's disease: Dx at age 42,([place], [place]), s/p R hemicolectomy with TI resection at age 49, s/p L colon resection due to abscess with colostomy at [facility], failed Humira, Cimzia, Remicade and most recently Entyvio.  Has been to multiple gastroenterologists at multiple centers.  Multiple admissions.  Most recently at [facility] transferred to [facility]. S/P EGD and colon (Dr Dorrene German) 02/21/2018 (Nl EGD, colon normal neo-TI and colon, neg Bx). CT 02/2018: skip lesions but minimal inflammation. #3.  Cyclical nausea/vomiting. Neg EGD, CT neg for SBO. #4.  Comorbid conditions including anxiety/depression.  Plan: - Surgery ([name]) has suggested IR consultation for port check/flush. - Start Kdur 52mq po qd. - Quit smoking. - FU [facility]. - Trial of  Lomotil 3/day and if still with problems, trial of cholestyramine 4 g p.o. twice daily. - If still will problems, will consider 48-72 hr stool collection and investigations. - Patient has gone over risks and benefits of Gattex in detail.  She is willing to give it a try.  [facility] get in touch with the patient. - Please obtain previous records. [facility]. No indication for steroids at the present time since last EGD and colonoscopy did not show any significant inflammation.  HPI:    Stacey HELBERTis a 42y.o. female  Very complicated unfortunate patient Has been to multiple gastroenterologists at multiple centers. with history of Crohn's disease- Dx at age 42,([place], ), status post right hemicolectomy with TI resection at the age 89 status post left colonic [facility] due [facility] failed Humira, Cimzia, Remicade and most recently Entyvio.  Has been to  multiple gastroenterologists at multiple centers.  Multiple admissions.  [facility] and colon (Dr RDorrene German 02/21/2018 (Nl EGD, colon normal neo-TI and colon, neg Bx). CT 02/2018: skip lesions but minimal inflammation.  With continued diarrhea -empties her ileostomy bag 10-15 times a day -watery bowel movements without any blood.   No further nausea/vomiting.  her symptoms were attributed to possible short bowel syndrome and cyclical nausea/vomiting with electrolyte abnormalities.  Status post port placement.  Unfortunately we have not been able to use the port for now.  Her lab work has been stable-chronically low potassium, normal magnesium, she always had elevated WBC count, platelet count.  Hemoglobin has been normal.  She has gained weight as below:  Wt Readings from Last 3 Encounters:  07/26/18 143 lb 4 oz (65 kg)  06/20/18 136 lb 6 oz (61.9 kg)  06/15/18 136 lb (61.7 kg)     Past Medical History:  Diagnosis Date  . Anxiety   . Crohn's disease ([place]   . Herpes   . Hypokalemia   . Insomnia   . Lyme disease   . Major depressive disorder   . Migraines   . Short bowel syndrome   . Tachycardia   . Vitamin B12 deficiency     Past Surgical History:  Procedure Laterality Date  . ABDOMINAL HYSTERECTOMY    . APPENDECTOMY  1990s  . COLOSTOMY  2017   Dr [place]armell [place] . PORTACATH  PLACEMENT Left 06/15/2018   Procedure: INSERTION PORT-A-CATH UNDER ULTRASOUND AND FLUROSCOPIC GUIDANCE;  Surgeon: Michael Boston, MD;  Location: [facility];  Service: General;  Laterality: Left;  . RIGHT COLECTOMY  2015   Dr Carrington Clamp.  Resection of ileocolonic anastomotic stricture.     Family History  Adopted: Yes  Family history unknown: Yes    Social History   Tobacco Use  . Smoking status: Current Every Day Smoker    Packs/day: 0.25    Types: Cigarettes  . Smokeless tobacco: Never Used  . Tobacco comment:  working on quiting a pack every 3 days   Substance Use Topics  . Alcohol use: No  . Drug use: No    Current Outpatient Medications  Medication Sig Dispense Refill  . AMBULATORY NON FORMULARY MEDICATION Inject 1,000 mLs into the vein 3 (three) times a week. Medication Name: IV NS with 40 meq Potassium infuse over 3 hours; give 3 times per week for 3 months; port flushes per pharmacy protocol; may flush port on date of order and draw lab work-CBC,CMP,Magnesium; 36 Dose 0  . clonazePAM (KLONOPIN) 0.5 MG tablet Take 0.5 mg by mouth 2 (two) times daily as needed for anxiety.     . diphenhydrAMINE (BENADRYL) 25 MG tablet Take 25 mg by mouth daily as needed for allergies.    . promethazine (PHENERGAN) 25 MG tablet Take 1 tablet (25 mg total) by mouth every 8 (eight) hours as needed for nausea or vomiting. 30 tablet 2  . QNASL 80 MCG/ACT AERS 1 spray as needed.  0  . sertraline (ZOLOFT) 100 MG tablet Take 100 mg by mouth daily at 12 noon.   2  . SUMAtriptan (IMITREX) 25 MG tablet Take 25 mg by mouth as needed for migraine.     . traZODone (DESYREL) 100 MG tablet Take 50 mg by mouth at bedtime.    . vortioxetine HBr (TRINTELLIX) 20 MG TABS Take 20 mg by mouth daily at 12 noon.      No current facility-administered medications for this visit.     No Known Allergies  Review of Systems:  Constitutional: Denies fever, chills, diaphoresis, appetite change and fatigue.  HEENT: Denies photophobia, eye pain, redness, hearing loss, ear pain, congestion, sore throat, rhinorrhea, sneezing, mouth sores, neck pain, neck stiffness and tinnitus.   Respiratory: Denies SOB, DOE, cough, chest tightness,  and wheezing.   Cardiovascular: Denies chest pain, palpitations and leg swelling.  Genitourinary: Denies dysuria, urgency, frequency, hematuria, flank pain and difficulty urinating.  Musculoskeletal: Denies myalgias, back pain, joint swelling, arthralgias and gait problem.  Skin: No rash.  Neurological: Has  dizziness. No seizures, syncope, weakness, light-headedness, numbness and headaches.  Hematological: Denies adenopathy. Easy bruising, personal or family bleeding history  Psychiatric/Behavioral: has anxiety or depression     Physical Exam:    BP 104/70   Pulse 81   Ht 5' 6"  (1.676 m)   Wt 143 lb 4 oz (65 kg)   BMI 23.12 kg/m  Filed Weights   07/26/18 1112  Weight: 143 lb 4 oz (65 kg)   Constitutional:  Well-developed, in no acute distress. Psychiatric: Normal mood and affect. Behavior is normal. HEENT: Pupils normal.  Conjunctivae are normal. No scleral icterus. Neck supple.  Cardiovascular: Normal rate, regular rhythm. No edema.  Port in the right supraclavicular area Pulmonary/chest: Effort normal and breath sounds normal. No wheezing, rales or rhonchi. Abdominal: Soft, nondistended. Nontender. Bowel sounds active throughout. There are no masses palpable. No hepatomegaly. Has  ostomy Rectal:  defered Neurological: Alert and oriented to person place and time. Skin: Skin is warm and dry. No rashes noted. 25 minutes spent with the patient today. Greater than 50% was spent in counseling and coordination of care with the patient   Carmell Austria, MD 07/26/2018, 11:36 AM  Cc: Jackquline Denmark, MD

## 2018-07-26 NOTE — Addendum Note (Signed)
Addended by: Caffie Pinto on: 07/26/2018 12:32 PM   Modules accepted: Orders

## 2018-07-27 ENCOUNTER — Telehealth: Payer: Self-pay | Admitting: Gastroenterology

## 2018-07-27 ENCOUNTER — Encounter (HOSPITAL_COMMUNITY): Payer: Managed Care, Other (non HMO)

## 2018-07-27 NOTE — Addendum Note (Signed)
Addended by: Karena Addison on: 07/27/2018 10:04 AM   Modules accepted: Orders

## 2018-07-27 NOTE — Telephone Encounter (Signed)
Pt left msg with answering service yesterday 4:13pm states that she saw Dr Lyndel Safe yesterday and developed a sudden rash not sure if due to Crohn's or Lyme

## 2018-07-28 ENCOUNTER — Ambulatory Visit (HOSPITAL_COMMUNITY)
Admission: RE | Admit: 2018-07-28 | Discharge: 2018-07-28 | Disposition: A | Discharge status: Home / Self Care | Payer: Managed Care, Other (non HMO) | Source: Ambulatory Visit | Attending: Gastroenterology | Admitting: Gastroenterology

## 2018-07-28 DIAGNOSIS — E46 Unspecified protein-calorie malnutrition: Secondary | ICD-10-CM

## 2018-07-28 DIAGNOSIS — K912 Postsurgical malabsorption, not elsewhere classified: Secondary | ICD-10-CM

## 2018-07-28 DIAGNOSIS — R197 Diarrhea, unspecified: Secondary | ICD-10-CM

## 2018-07-28 DIAGNOSIS — K50919 Crohn's disease, unspecified, with unspecified complications: Secondary | ICD-10-CM

## 2018-07-29 ENCOUNTER — Encounter (HOSPITAL_COMMUNITY): Payer: Managed Care, Other (non HMO)

## 2018-07-29 NOTE — Telephone Encounter (Signed)
Rash not because of Crohn's I think Eurcrisa cream should help

## 2018-07-29 NOTE — Telephone Encounter (Signed)
Called ans spoke with patient-patient reports she went on Thursday-went to IR at [facility]-developed a "horrible migraine/vomiting-dry heaving-"felt like I was going to die", ended up leaving radiology due feeling so sick and because they "backed up my appointment to after 5:00 pm" -rash all over L side of back from shoulder all the way down the back and moves towards the stomach-given Eurcrisa cream- today at PCP was given IVF and also blood drawn also- Patient is wanting to know what can be done with this awful rash and feeling bad Please advise

## 2018-08-01 ENCOUNTER — Inpatient Hospital Stay (HOSPITAL_COMMUNITY): Admission: RE | Admit: 2018-08-01 | Payer: Managed Care, Other (non HMO) | Source: Ambulatory Visit

## 2018-08-01 NOTE — Telephone Encounter (Signed)
Called and spoke with patient-informed patient of MD recommendations for rash; Patient reports she is being seen in radiology on Wednesday 08/03/18; patient reports she will update the office once the xray of her chest is completed so "we can figure out the next step for me to head in so I can start to feel better";  Patient advised to call back if questions/concerns arise;

## 2018-08-03 ENCOUNTER — Ambulatory Visit (HOSPITAL_COMMUNITY)
Admission: RE | Admit: 2018-08-03 | Discharge: 2018-08-03 | Disposition: A | Discharge status: Home / Self Care | Payer: Managed Care, Other (non HMO) | Source: Ambulatory Visit | Attending: Gastroenterology | Admitting: Gastroenterology

## 2018-08-03 ENCOUNTER — Inpatient Hospital Stay (HOSPITAL_COMMUNITY): Admission: RE | Admit: 2018-08-03 | Payer: Managed Care, Other (non HMO) | Source: Ambulatory Visit

## 2018-08-03 ENCOUNTER — Encounter (HOSPITAL_COMMUNITY): Payer: Self-pay | Admitting: Interventional Radiology

## 2018-08-03 DIAGNOSIS — K912 Postsurgical malabsorption, not elsewhere classified: Secondary | ICD-10-CM | POA: Insufficient documentation

## 2018-08-03 DIAGNOSIS — T859XXA Unspecified complication of internal prosthetic device, implant and graft, initial encounter: Secondary | ICD-10-CM | POA: Diagnosis not present

## 2018-08-03 DIAGNOSIS — K50919 Crohn's disease, unspecified, with unspecified complications: Secondary | ICD-10-CM | POA: Diagnosis not present

## 2018-08-03 DIAGNOSIS — R197 Diarrhea, unspecified: Secondary | ICD-10-CM | POA: Insufficient documentation

## 2018-08-03 DIAGNOSIS — Y831 Surgical operation with implant of artificial internal device as the cause of abnormal reaction of the patient, or of later complication, without mention of misadventure at the time of the procedure: Secondary | ICD-10-CM | POA: Insufficient documentation

## 2018-08-03 DIAGNOSIS — E46 Unspecified protein-calorie malnutrition: Secondary | ICD-10-CM | POA: Diagnosis not present

## 2018-08-03 HISTORY — PX: IR CV LINE INJECTION: IMG2294

## 2018-08-03 MED ORDER — HEPARIN SOD (PORK) LOCK FLUSH 100 UNIT/ML IV SOLN
INTRAVENOUS | Status: AC
  Filled 2018-08-03: qty 5

## 2018-08-03 MED ORDER — IOPAMIDOL (ISOVUE-300) INJECTION 61%
INTRAVENOUS | Status: AC
  Filled 2018-08-03: qty 50

## 2018-08-05 ENCOUNTER — Ambulatory Visit (HOSPITAL_COMMUNITY)
Admission: RE | Admit: 2018-08-05 | Discharge: 2018-08-05 | Disposition: A | Discharge status: Home / Self Care | Payer: Managed Care, Other (non HMO) | Source: Ambulatory Visit | Attending: Gastroenterology | Admitting: Gastroenterology

## 2018-08-05 MED ORDER — SODIUM CHLORIDE 0.9% FLUSH
10.0000 mL | INTRAVENOUS | Status: AC | PRN
  Administered 2018-08-05: 10 mL

## 2018-08-05 MED ORDER — LACTATED RINGERS IV BOLUS
1000.0000 mL | Freq: Once | INTRAVENOUS | Status: AC
  Administered 2018-08-05: 1000 mL via INTRAVENOUS

## 2018-08-05 MED ORDER — HEPARIN SOD (PORK) LOCK FLUSH 100 UNIT/ML IV SOLN
500.0000 [IU] | INTRAVENOUS | Status: AC | PRN
  Administered 2018-08-05: 500 [IU]
  Filled 2018-08-05: qty 5

## 2018-08-05 NOTE — Progress Notes (Signed)
PATIENT CARE CENTER NOTE  Diagnosis: Short gut syndrome/malabsorption    Provider: Dr. Jackquline Denmark    Procedure: 1 liter bolus of Lactated Ringers    Note: Patient received 1 liter bolus of LR through right chest PAC. Tolerated infusion well with no adverse reaction. Vital signs stable. Discharge instructions given. Patient alert, oriented and ambulatory at discharge.

## 2018-08-05 NOTE — Discharge Instructions (Signed)
Dehydration, Adult  Dehydration is when there is not enough fluid or water in your body. This happens when you lose more fluids than you take in. Dehydration can range from mild to very bad. It should be treated right away to keep it from getting very bad. Symptoms of mild dehydration may include:  Thirst.  Dry lips.  Slightly dry mouth.  Dry, warm skin.  Dizziness. Symptoms of moderate dehydration may include:  Very dry mouth.  Muscle cramps.  Dark pee (urine). Pee may be the color of tea.  Your body making less pee.  Your eyes making fewer tears.  Heartbeat that is uneven or faster than normal (palpitations).  Headache.  Light-headedness, especially when you stand up from sitting.  Fainting (syncope). Symptoms of very bad dehydration may include:  Changes in skin, such as: ? Cold and clammy skin. ? Blotchy (mottled) or pale skin. ? Skin that does not quickly return to normal after being lightly pinched and let go (poor skin turgor).  Changes in body fluids, such as: ? Feeling very thirsty. ? Your eyes making fewer tears. ? Not sweating when body temperature is high, such as in hot weather. ? Your body making very little pee.  Changes in vital signs, such as: ? Weak pulse. ? Pulse that is more than 100 beats a minute when you are sitting still. ? Fast breathing. ? Low blood pressure.  Other changes, such as: ? Sunken eyes. ? Cold hands and feet. ? Confusion. ? Lack of energy (lethargy). ? Trouble waking up from sleep. ? Short-term weight loss. ? Unconsciousness. Follow these instructions at home:   If told by your doctor, drink an ORS: ? Make an ORS by using instructions on the package. ? Start by drinking small amounts, about  cup (120 mL) every 5-10 minutes. ? Slowly drink more until you have had the amount that your doctor said to have.  Drink enough clear fluid to keep your pee clear or pale yellow. If you were told to drink an ORS, finish the  ORS first, then start slowly drinking clear fluids. Drink fluids such as: ? Water. Do not drink only water by itself. Doing that can make the salt (sodium) level in your body get too low (hyponatremia). ? Ice chips. ? Fruit juice that you have added water to (diluted). ? Low-calorie sports drinks.  Avoid: ? Alcohol. ? Drinks that have a lot of sugar. These include high-calorie sports drinks, fruit juice that does not have water added, and soda. ? Caffeine. ? Foods that are greasy or have a lot of fat or sugar.  Take over-the-counter and prescription medicines only as told by your doctor.  Do not take salt tablets. Doing that can make the salt level in your body get too high (hypernatremia).  Eat foods that have minerals (electrolytes). Examples include bananas, oranges, potatoes, tomatoes, and spinach.  Keep all follow-up visits as told by your doctor. This is important. Contact a doctor if:  You have belly (abdominal) pain that: ? Gets worse. ? Stays in one area (localizes).  You have a rash.  You have a stiff neck.  You get angry or annoyed more easily than normal (irritability).  You are more sleepy than normal.  You have a harder time waking up than normal.  You feel: ? Weak. ? Dizzy. ? Very thirsty.  You have peed (urinated) only a small amount of very dark pee during 6-8 hours. Get help right away if:  You have  symptoms of very bad dehydration.  You cannot drink fluids without throwing up (vomiting).  Your symptoms get worse with treatment.  You have a fever.  You have a very bad headache.  You are throwing up or having watery poop (diarrhea) and it: ? Gets worse. ? Does not go away.  You have blood or something green (bile) in your throw-up.  You have blood in your poop (stool). This may cause poop to look black and tarry.  You have not peed in 6-8 hours.  You pass out (faint).  Your heart rate when you are sitting still is more than 100 beats a  minute.  You have trouble breathing. This information is not intended to replace advice given to you by your health care provider. Make sure you discuss any questions you have with your health care provider. Document Released: 05/30/2009 Document Revised: 02/21/2016 Document Reviewed: 09/27/2015 Elsevier Interactive Patient Education  2019 [employer].

## 2018-08-08 ENCOUNTER — Encounter (HOSPITAL_COMMUNITY): Payer: Managed Care, Other (non HMO)

## 2018-08-12 ENCOUNTER — Ambulatory Visit (HOSPITAL_COMMUNITY)
Admission: RE | Admit: 2018-08-12 | Discharge: 2018-08-12 | Disposition: A | Discharge status: Home / Self Care | Payer: Managed Care, Other (non HMO) | Source: Ambulatory Visit | Attending: Gastroenterology | Admitting: Gastroenterology

## 2018-08-12 ENCOUNTER — Emergency Department (HOSPITAL_COMMUNITY)
Admission: EM | Admit: 2018-08-12 | Discharge: 2018-08-13 | Discharge status: Against Medical Advice | Payer: Managed Care, Other (non HMO) | Source: Home / Self Care | Attending: Emergency Medicine | Admitting: Emergency Medicine

## 2018-08-12 ENCOUNTER — Encounter (HOSPITAL_COMMUNITY): Payer: Self-pay | Admitting: Emergency Medicine

## 2018-08-12 ENCOUNTER — Telehealth: Payer: Self-pay | Admitting: Gastroenterology

## 2018-08-12 DIAGNOSIS — R197 Diarrhea, unspecified: Secondary | ICD-10-CM

## 2018-08-12 DIAGNOSIS — E860 Dehydration: Secondary | ICD-10-CM

## 2018-08-12 LAB — URINALYSIS, ROUTINE W REFLEX MICROSCOPIC
BILIRUBIN URINE: NEGATIVE
Glucose, UA: NEGATIVE mg/dL
HGB URINE DIPSTICK: NEGATIVE
Ketones, ur: NEGATIVE mg/dL
Leukocytes, UA: NEGATIVE
Nitrite: NEGATIVE
Protein, ur: NEGATIVE mg/dL
Specific Gravity, Urine: 1.01 (ref 1.005–1.030)
pH: 5 (ref 5.0–8.0)

## 2018-08-12 MED ORDER — HEPARIN SOD (PORK) LOCK FLUSH 100 UNIT/ML IV SOLN
500.0000 [IU] | INTRAVENOUS | Status: AC | PRN
  Administered 2018-08-12: 500 [IU]
  Filled 2018-08-12: qty 5

## 2018-08-12 MED ORDER — LACTATED RINGERS IV BOLUS: 1000.0000 mL | Freq: Once | INTRAVENOUS | Status: DC

## 2018-08-12 MED ORDER — LACTATED RINGERS IV BOLUS
1000.0000 mL | Freq: Once | INTRAVENOUS | Status: AC
  Administered 2018-08-12: 1000 mL via INTRAVENOUS

## 2018-08-12 MED ORDER — SODIUM CHLORIDE 0.9% FLUSH
10.0000 mL | INTRAVENOUS | Status: AC | PRN
  Administered 2018-08-12: 10 mL

## 2018-08-12 MED ORDER — DIPHENHYDRAMINE HCL 50 MG/ML IJ SOLN: 12.5000 mg | Freq: Once | INTRAMUSCULAR | Status: DC

## 2018-08-12 MED ORDER — METOCLOPRAMIDE HCL 5 MG/ML IJ SOLN: 10.0000 mg | Freq: Once | INTRAMUSCULAR | Status: DC

## 2018-08-12 NOTE — Progress Notes (Signed)
PATIENT CARE CENTER NOTE  Diagnosis: Short gut syndrome/malabsorption    Provider: Dr. Jackquline Denmark    Procedure: 1 liter bolus of Lactated Ringers    Note: Patient received 1 liter bolus of LR through right chest PAC. Tolerated infusion well with no adverse reaction. Vital signs stable. Discharge instructions given. Patient alert, oriented and ambulatory at discharge.

## 2018-08-12 NOTE — Telephone Encounter (Signed)
Called and spoke with patient about the issues she is dealing with. She is having significant amounts of ostomy output that have progressed over the last few days. She has trialed Fiber supplementation, Lomotil, Immodium, and Cholestyramine in the workup of this. The patient came in for IV Hydration today but remains feeling unwell. She describes having some bloody output and mucous production as well with her bowel habits. She has not had fevers but has had abdominal cramping. She is not able to tolerate significant oral either. As such I do not think that she can remain an outpatient at this point in time. She needs electrolytes checked as well as some stool studies as it looks like she has a history of an end colostomy with recent colonoscopy earlier this fall that did not show any issues at her neoterminal ileum.  She may be at risk of having the development of C. difficile as well as other infections since she still has a:.  I would like her to also have inflammatory markers checked as well. She is in the process of considering a short gut medication known as Gattex but has not finalized the decision on that and is still awaiting a GI IBD consultation at [facility]. We discussed the role of coming in for electrolytes being checked as well as stool studies to ensure that she is able to maintain her hydration I suspect she will need to be observed into tomorrow at least. I would obtain a CBC with differential, CMP, and ESR, CRP. I would obtain a TSH with reflex. I would obtain stool studies and most assuredly rule out C. difficile as well for her. I would hold on steroids being initiated for her with her history of osteopenia unless there is concern of active inflammation. Not sure that a imaging study is necessary currently but that will be deferred to the providers who are evaluating her in person. I will send this message to Dr. Collene Mares who is covering for [facility] gastroenterology over the  weekend. I have called the [facility] charge nurse at the ED so that they are aware of the patient coming to the hospital.  Justice Britain, MD [facility] Gastroenterology Advanced Endoscopy Office # [phone]

## 2018-08-12 NOTE — ED Notes (Signed)
Per GI MD-patient coming in for diarrhea-having increased episodes of loose stool-states passing out due to GI symptoms-sending her to ED for eval and possible admission-he will let on call GI MD to expect call form EDP

## 2018-08-12 NOTE — Telephone Encounter (Signed)
I do not have anything about her being referred to Dr. Lamonte Sakai. I also do not see anything in her chart about this.

## 2018-08-12 NOTE — ED Triage Notes (Addendum)
Migraine that is normal due to Lyme Disease. Intermittent abd pains. Pt has crohn's disease.  Pt sent by PCP for admission due to increased osotomy output and electrolyte replenishment via IV.

## 2018-08-12 NOTE — Telephone Encounter (Signed)
Patient states Dr.Gupta was suppose to be referring her to a Dr.Byrum? And she has not heard anything from them, even after calling and leaving a vm. Patient states she is having lots of problems with her ostomy and wants to know what Dr.Gupta suggests.

## 2018-08-12 NOTE — Telephone Encounter (Signed)
I cannot find any information regarding this patient being referred to Dr. Lamonte Sakai concerning her ostomy care-do you know anything about it? If so, do you have an update you can give to the patient? Please advise

## 2018-08-12 NOTE — Telephone Encounter (Signed)
Doc of the day-Called and spoke with patient (Dr. Steve Rattler pt)- patient report she is having upwards of 600 ml of output from her ostomy and emptying the ostomy at least 15-20 times per day; patient is not able to tolerate increasing her fiber, Lomotil and Imodium AD do not work for her, and she is only able to eat "a palm's worth of food at a time as I feel so bloated and miserable"; patient is requesting to have some type of medication/assistance in helping to bulk up the ostomy output because the IV fluids she is receiving 2 times per week are not really helping with the dehydration;  please advise as patient has not been able to get in to see Dr. Renee Harder for her short gut syndrome/crohn's and information/instructions for Gattex and Dr. Lyndel Safe is not in the office today;

## 2018-08-12 NOTE — ED Provider Notes (Signed)
[facility] DEPT Provider Note   CSN: [id number] Arrival date & time: 08/12/18  7893     History   Chief Complaint Chief Complaint  Patient presents with  . Diarrhea  . sent for admission    HPI Stacey Simpson is a 42 y.o. female.  Patient with long standing history of Crohn's Disease complicated by colostomy and short bowel/malabsorption syndrome, Lyme's disease, migraine, presents on the advice of her gastroenterologist for admission for IV rehydration. She has been having significantly increased ostomy output described as sometimes bloody with mucus in normal stool , receiving in-office LR infusions twice weekly, unable to tolerate anything but small amounts of PO nutrition. Appreciate Dr. Donneta Romberg detailed note on record. The patient has been referred to the ED for admission as it is felt her condition would best be treated as an inpatient at this point. Currently the patient complains of a headache she reports as "usual" for her. She denies fever at any time. She has had nausea with recent vomiting but none today.   The history is provided by the patient. No language interpreter was used.  Diarrhea   Associated symptoms include abdominal pain (Abdominal cramping with stool movement) and headaches. Pertinent negatives include no chills.    Past Medical History:  Diagnosis Date  . Anxiety   . Crohn's disease ([place])   . Herpes   . Hypokalemia   . Insomnia   . Lyme disease   . Major depressive disorder   . Migraines   . Short bowel syndrome   . Tachycardia   . Vitamin B12 deficiency     Patient Active Problem List   Diagnosis Date Noted  . 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 Anxiety 06/15/2018  . Rheumatoid arthritis ([place]) 06/15/2018  . Malabsorption 06/15/2018  . Osteopenia 06/15/2018  . Hypercholesteremia 06/15/2018  . Chronic diarrhea 06/15/2018  . Crohn's disease of  both small and large intestine with other complication ([place]) 81/08/7508  . 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/18/2018  . Major depression, recurrent ([place]) 07/17/2017  . Tobacco use disorder 04/21/2017  . Malnutrition ([place]) 03/08/2017  . Loss of appetite 03/08/2017  . Palpitations 04/30/2016  . Headache, chronic daily 10/19/2014  . Carpal tunnel syndrome, bilateral 12/15/2012    Past Surgical History:  Procedure Laterality Date  . ABDOMINAL HYSTERECTOMY    . APPENDECTOMY  1990s  . COLOSTOMY  2017   Dr Carmell Austria, [place], [place]  . IR CV LINE INJECTION  08/03/2018  . PORTACATH PLACEMENT Left 06/15/2018   Procedure: INSERTION PORT-A-CATH UNDER ULTRASOUND AND FLUROSCOPIC GUIDANCE;  Surgeon: Michael Boston, MD;  Location: [facility] ORS;  Service: General;  Laterality: Left;  . RIGHT COLECTOMY  2015   Dr Carrington Clamp.  Resection of ileocolonic anastomotic stricture.      OB History   No obstetric history on file.      Home Medications    Prior to Admission medications   Medication Sig Start Date End Date Taking? Authorizing Provider  AMBULATORY NON FORMULARY MEDICATION Inject 1,000 mLs into the vein 3 (three) times a week. Medication Name: IV NS with 40 meq Potassium infuse over 3 hours; give 3 times per week for 3 months; port flushes per pharmacy protocol; may flush port on date of order and draw lab work-CBC,CMP,Magnesium; 07/22/18   Jackquline Denmark, MD  clonazePAM (KLONOPIN) 0.5 MG tablet Take 0.5 mg by mouth 2 (two)  times daily as needed for anxiety.  03/29/17   [provider]  diphenhydrAMINE (BENADRYL) 25 MG tablet Take 25 mg by mouth daily as needed for allergies.    [provider]  Potassium Chloride ER 20 MEQ TBCR Take 20 mEq by mouth daily. 07/26/18   Jackquline Denmark, MD  promethazine (PHENERGAN) 25 MG tablet Take 1 tablet (25 mg total) by mouth every 8 (eight) hours as needed for nausea or vomiting. 06/20/18   Jackquline Denmark, MD  QNASL 80  MCG/ACT AERS 1 spray as needed. 06/20/18   [provider]  sertraline (ZOLOFT) 100 MG tablet Take 100 mg by mouth daily at 12 noon.  05/26/18   [provider]  SUMAtriptan (IMITREX) 25 MG tablet Take 25 mg by mouth as needed for migraine.     [provider]  traZODone (DESYREL) 100 MG tablet Take 50 mg by mouth at bedtime.    [provider]  vortioxetine HBr (TRINTELLIX) 20 MG TABS Take 20 mg by mouth daily at 12 noon.  07/23/16   [provider]    Family History Family History  Adopted: Yes  Family history unknown: Yes    Social History Social History   Tobacco Use  . Smoking status: Current Every Day Smoker    Packs/day: 0.25    Types: Cigarettes  . Smokeless tobacco: Never Used  Substance Use Topics  . Alcohol use: No  . Drug use: No     Allergies   Patient has no known allergies.   Review of Systems Review of Systems  Constitutional: Positive for appetite change. Negative for chills and fever.  HENT: Negative.   Respiratory: Negative.   Cardiovascular: Negative.   Gastrointestinal: Positive for abdominal pain (Abdominal cramping with stool movement), blood in stool, diarrhea and nausea.  Musculoskeletal: Negative.   Skin: Negative.   Neurological: Positive for weakness (generalized) and headaches.     Physical Exam Updated Vital Signs BP (!) 140/100 (BP Location: Left Arm)   Pulse (!) 105   Temp 98.7 F (37.1 C) (Oral)   Resp 18   SpO2 97%   Physical Exam Vitals signs and nursing note reviewed.  Constitutional:      Appearance: She is well-developed.  HENT:     Head: Normocephalic.  Neck:     Musculoskeletal: Normal range of motion and neck supple.  Cardiovascular:     Rate and Rhythm: Regular rhythm. Tachycardia present.  Pulmonary:     Effort: Pulmonary effort is normal.     Breath sounds: Normal breath sounds.  Abdominal:     General: There is no distension.     Palpations: Abdomen is soft.      Tenderness: There is no abdominal tenderness. There is no guarding or rebound.     Comments: Ostomy in place in LLQ abdomen containing light yellow stool. No blood visualized.  Musculoskeletal: Normal range of motion.  Skin:    General: Skin is warm and dry.     Findings: No rash.  Neurological:     General: No focal deficit present.     Mental Status: She is alert and oriented to person, place, and time.     Cranial Nerves: No cranial nerve deficit.      ED Treatments / Results  Labs (all labs ordered are listed, but only abnormal results are displayed) Labs Reviewed  URINALYSIS, ROUTINE W REFLEX MICROSCOPIC  LIPASE, BLOOD  COMPREHENSIVE METABOLIC PANEL  CBC    EKG None  Radiology  No results found.  Procedures Procedures (including critical care time)  Medications Ordered in ED Medications - No data to display   Initial Impression / Assessment and Plan / ED Course  I have reviewed the triage vital signs and the nursing notes.  Pertinent labs & imaging results that were available during my care of the patient were reviewed by me and considered in my medical decision making (see chart for details).  Clinical Course as of Aug 14 115  Fri Aug 12, 2018  2305 She was sent in from her GI doctor's office for admission for IV hydration.  She is got multiple biotic medical issues including Crohn's and high output from her ostomy.  She needs frequent IV infusions of fluids secondary getting dehydrated very easily.  She is had a port placed a couple of months ago and has been very difficult access in use.  She had a liter of IV fluids earlier today she is here now for admission.  We will try and get the port work so we get some blood work and give her some fluids and will contact the hospitalist for admission   [MB]    Clinical Course User Index [MB] Hayden Rasmussen, MD    Patient with a history of Crohn's, s/p colostomy LLQ, with increased ostomy output that has increased  over the last several days. No fever. Has been receiving IV hydration in office but was sent to ED today for admission by gastroenterology for inpatient hydration and electrolyte check/replenishment. Detailed note by Dr. Rush Landmark very helpful. Orders placed as per his recommendation.   Abdomen is soft and nontender. No imaging if felt warranted currently. She complains of a headache per her usual symptoms. Benadryl and Reglan ordered.   Plan: will call for admission after labs are resulted.   Labs delayed due to difficulty accessing port, which does not appear to be functioning. No blood draw possible.   Delay in IV team arrival of several hours. Patient left the department without informing staff.   Final Clinical Impressions(s) / ED Diagnoses   Final diagnoses:  None   1. Crohn's disease 2. Increased ostomy output 3. Dehydration 4. [facility]  ED Discharge Orders    None       Charlann Lange, Hershal Coria 08/13/18 0120    Hayden Rasmussen, MD 08/27/18 1047

## 2018-08-12 NOTE — Discharge Instructions (Signed)
Dehydration, Adult  Dehydration is when there is not enough fluid or water in your body. This happens when you lose more fluids than you take in. Dehydration can range from mild to very bad. It should be treated right away to keep it from getting very bad. Symptoms of mild dehydration may include:  Thirst.  Dry lips.  Slightly dry mouth.  Dry, warm skin.  Dizziness. Symptoms of moderate dehydration may include:  Very dry mouth.  Muscle cramps.  Dark pee (urine). Pee may be the color of tea.  Your body making less pee.  Your eyes making fewer tears.  Heartbeat that is uneven or faster than normal (palpitations).  Headache.  Light-headedness, especially when you stand up from sitting.  Fainting (syncope). Symptoms of very bad dehydration may include:  Changes in skin, such as: ? Cold and clammy skin. ? Blotchy (mottled) or pale skin. ? Skin that does not quickly return to normal after being lightly pinched and let go (poor skin turgor).  Changes in body fluids, such as: ? Feeling very thirsty. ? Your eyes making fewer tears. ? Not sweating when body temperature is high, such as in hot weather. ? Your body making very little pee.  Changes in vital signs, such as: ? Weak pulse. ? Pulse that is more than 100 beats a minute when you are sitting still. ? Fast breathing. ? Low blood pressure.  Other changes, such as: ? Sunken eyes. ? Cold hands and feet. ? Confusion. ? Lack of energy (lethargy). ? Trouble waking up from sleep. ? Short-term weight loss. ? Unconsciousness. Follow these instructions at home:   If told by your doctor, drink an ORS: ? Make an ORS by using instructions on the package. ? Start by drinking small amounts, about  cup (120 mL) every 5-10 minutes. ? Slowly drink more until you have had the amount that your doctor said to have.  Drink enough clear fluid to keep your pee clear or pale yellow. If you were told to drink an ORS, finish the  ORS first, then start slowly drinking clear fluids. Drink fluids such as: ? Water. Do not drink only water by itself. Doing that can make the salt (sodium) level in your body get too low (hyponatremia). ? Ice chips. ? Fruit juice that you have added water to (diluted). ? Low-calorie sports drinks.  Avoid: ? Alcohol. ? Drinks that have a lot of sugar. These include high-calorie sports drinks, fruit juice that does not have water added, and soda. ? Caffeine. ? Foods that are greasy or have a lot of fat or sugar.  Take over-the-counter and prescription medicines only as told by your doctor.  Do not take salt tablets. Doing that can make the salt level in your body get too high (hypernatremia).  Eat foods that have minerals (electrolytes). Examples include bananas, oranges, potatoes, tomatoes, and spinach.  Keep all follow-up visits as told by your doctor. This is important. Contact a doctor if:  You have belly (abdominal) pain that: ? Gets worse. ? Stays in one area (localizes).  You have a rash.  You have a stiff neck.  You get angry or annoyed more easily than normal (irritability).  You are more sleepy than normal.  You have a harder time waking up than normal.  You feel: ? Weak. ? Dizzy. ? Very thirsty.  You have peed (urinated) only a small amount of very dark pee during 6-8 hours. Get help right away if:  You have  symptoms of very bad dehydration.  You cannot drink fluids without throwing up (vomiting).  Your symptoms get worse with treatment.  You have a fever.  You have a very bad headache.  You are throwing up or having watery poop (diarrhea) and it: ? Gets worse. ? Does not go away.  You have blood or something green (bile) in your throw-up.  You have blood in your poop (stool). This may cause poop to look black and tarry.  You have not peed in 6-8 hours.  You pass out (faint).  Your heart rate when you are sitting still is more than 100 beats a  minute.  You have trouble breathing. This information is not intended to replace advice given to you by your health care provider. Make sure you discuss any questions you have with your health care provider. Document Released: 05/30/2009 Document Revised: 02/21/2016 Document Reviewed: 09/27/2015 Elsevier Interactive Patient Education  2019 [employer].

## 2018-08-13 NOTE — ED Notes (Signed)
Pt not found in room at this time. MD made aware.

## 2018-08-13 NOTE — Progress Notes (Signed)
Responded to consult for [facility] access. RN reports patient left AMA.

## 2018-08-13 NOTE — ED Notes (Signed)
Pt SO walking with pt in hallway. Pt expressed frustration about not receiving care and PCP stating that pt needed to be admitted. This RN informed pt that IVT consult had been ordered and they were aware of pt needing access.

## 2018-08-15 ENCOUNTER — Other Ambulatory Visit: Payer: Self-pay

## 2018-08-15 ENCOUNTER — Ambulatory Visit (HOSPITAL_COMMUNITY)
Admission: RE | Admit: 2018-08-15 | Discharge: 2018-08-15 | Disposition: A | Discharge status: Home / Self Care | Payer: Managed Care, Other (non HMO) | Source: Ambulatory Visit | Attending: Gastroenterology | Admitting: Gastroenterology

## 2018-08-15 ENCOUNTER — Observation Stay (HOSPITAL_COMMUNITY)
Admission: EM | Admit: 2018-08-15 | Discharge: 2018-08-19 | Disposition: A | Discharge status: Home / Self Care | Payer: Managed Care, Other (non HMO) | Attending: Internal Medicine | Admitting: Internal Medicine

## 2018-08-15 ENCOUNTER — Telehealth: Payer: Self-pay | Admitting: Gastroenterology

## 2018-08-15 ENCOUNTER — Encounter (HOSPITAL_COMMUNITY): Payer: Self-pay

## 2018-08-15 DIAGNOSIS — K50918 Crohn's disease, unspecified, with other complication: Secondary | ICD-10-CM | POA: Diagnosis not present

## 2018-08-15 DIAGNOSIS — M8580 Other specified disorders of bone density and structure, unspecified site: Secondary | ICD-10-CM | POA: Insufficient documentation

## 2018-08-15 DIAGNOSIS — G43909 Migraine, unspecified, not intractable, without status migrainosus: Secondary | ICD-10-CM | POA: Diagnosis not present

## 2018-08-15 DIAGNOSIS — E860 Dehydration: Principal | ICD-10-CM | POA: Insufficient documentation

## 2018-08-15 DIAGNOSIS — E7800 Pure hypercholesterolemia, unspecified: Secondary | ICD-10-CM | POA: Diagnosis not present

## 2018-08-15 DIAGNOSIS — M069 Rheumatoid arthritis, unspecified: Secondary | ICD-10-CM | POA: Insufficient documentation

## 2018-08-15 DIAGNOSIS — Z933 Colostomy status: Secondary | ICD-10-CM | POA: Diagnosis not present

## 2018-08-15 DIAGNOSIS — R000 Tachycardia, unspecified: Secondary | ICD-10-CM | POA: Insufficient documentation

## 2018-08-15 DIAGNOSIS — D72829 Elevated white blood cell count, unspecified: Secondary | ICD-10-CM | POA: Diagnosis not present

## 2018-08-15 DIAGNOSIS — F17210 Nicotine dependence, cigarettes, uncomplicated: Secondary | ICD-10-CM | POA: Diagnosis not present

## 2018-08-15 DIAGNOSIS — K5080 Crohn's disease of both small and large intestine without complications: Secondary | ICD-10-CM | POA: Diagnosis not present

## 2018-08-15 DIAGNOSIS — Z932 Ileostomy status: Secondary | ICD-10-CM | POA: Diagnosis not present

## 2018-08-15 DIAGNOSIS — Z79899 Other long term (current) drug therapy: Secondary | ICD-10-CM | POA: Diagnosis not present

## 2018-08-15 DIAGNOSIS — N179 Acute kidney failure, unspecified: Secondary | ICD-10-CM | POA: Diagnosis not present

## 2018-08-15 DIAGNOSIS — F329 Major depressive disorder, single episode, unspecified: Secondary | ICD-10-CM | POA: Insufficient documentation

## 2018-08-15 DIAGNOSIS — Z433 Encounter for attention to colostomy: Secondary | ICD-10-CM | POA: Diagnosis not present

## 2018-08-15 DIAGNOSIS — G4700 Insomnia, unspecified: Secondary | ICD-10-CM | POA: Diagnosis not present

## 2018-08-15 DIAGNOSIS — Z881 Allergy status to other antibiotic agents status: Secondary | ICD-10-CM | POA: Diagnosis not present

## 2018-08-15 DIAGNOSIS — K912 Postsurgical malabsorption, not elsewhere classified: Secondary | ICD-10-CM | POA: Diagnosis not present

## 2018-08-15 DIAGNOSIS — R198 Other specified symptoms and signs involving the digestive system and abdomen: Secondary | ICD-10-CM

## 2018-08-15 DIAGNOSIS — K5090 Crohn's disease, unspecified, without complications: Secondary | ICD-10-CM

## 2018-08-15 DIAGNOSIS — N182 Chronic kidney disease, stage 2 (mild): Secondary | ICD-10-CM | POA: Diagnosis not present

## 2018-08-15 DIAGNOSIS — K90829 Short bowel syndrome, unspecified: Secondary | ICD-10-CM | POA: Diagnosis present

## 2018-08-15 LAB — COMPREHENSIVE METABOLIC PANEL
ALK PHOS: 91 U/L (ref 38–126)
ALT: 10 U/L (ref 0–44)
AST: 14 U/L — AB (ref 15–41)
Albumin: 3.3 g/dL — ABNORMAL LOW (ref 3.5–5.0)
Anion gap: 7 (ref 5–15)
BUN: 10 mg/dL (ref 6–20)
CO2: 22 mmol/L (ref 22–32)
CREATININE: 1.18 mg/dL — AB (ref 0.44–1.00)
Calcium: 8.7 mg/dL — ABNORMAL LOW (ref 8.9–10.3)
Chloride: 110 mmol/L (ref 98–111)
GFR calc Af Amer: >60 mL/min (ref >60)
GFR calc non Af Amer: 56 mL/min — ABNORMAL LOW (ref >60)
Glucose, Bld: 87 mg/dL (ref 70–99)
Potassium: 3.7 mmol/L (ref 3.5–5.1)
Sodium: 139 mmol/L (ref 135–145)
Total Bilirubin: 0.1 mg/dL — ABNORMAL LOW (ref 0.3–1.2)
Total Protein: 6.4 g/dL — ABNORMAL LOW (ref 6.5–8.1)

## 2018-08-15 LAB — CBC WITH DIFFERENTIAL/PLATELET
ABS IMMATURE GRANULOCYTES: 0.03 10*3/uL (ref 0.00–0.07)
Basophils Absolute: 0 10*3/uL (ref 0.0–0.1)
Basophils Relative: 0 %
Eosinophils Absolute: 0.2 10*3/uL (ref 0.0–0.5)
Eosinophils Relative: 2 %
HCT: 38.1 % (ref 36.0–46.0)
Hemoglobin: 11.9 g/dL — ABNORMAL LOW (ref 12.0–15.0)
Immature Granulocytes: 0 %
LYMPHS ABS: 3.1 10*3/uL (ref 0.7–4.0)
Lymphocytes Relative: 33 %
MCH: 29.4 pg (ref 26.0–34.0)
MCHC: 31.2 g/dL (ref 30.0–36.0)
MCV: 94.1 fL (ref 80.0–100.0)
Monocytes Absolute: 0.5 10*3/uL (ref 0.1–1.0)
Monocytes Relative: 6 %
Neutro Abs: 5.6 10*3/uL (ref 1.7–7.7)
Neutrophils Relative %: 59 %
Platelets: 381 10*3/uL (ref 150–400)
RBC: 4.05 MIL/uL (ref 3.87–5.11)
RDW: 13.2 % (ref 11.5–15.5)
WBC: 9.4 10*3/uL (ref 4.0–10.5)
nRBC: 0 % (ref 0.0–0.2)

## 2018-08-15 LAB — MAGNESIUM: Magnesium: 2.1 mg/dL (ref 1.7–2.4)

## 2018-08-15 MED ORDER — PROMETHAZINE HCL 25 MG PO TABS
25.0000 mg | ORAL_TABLET | Freq: Three times a day (TID) | ORAL | Status: DC | PRN
  Filled 2018-08-15: qty 1

## 2018-08-15 MED ORDER — SODIUM CHLORIDE 0.9 % IV BOLUS
1000.0000 mL | Freq: Once | INTRAVENOUS | Status: AC
  Administered 2018-08-15: 1000 mL via INTRAVENOUS

## 2018-08-15 MED ORDER — ENOXAPARIN SODIUM 40 MG/0.4ML ~~LOC~~ SOLN
40.0000 mg | SUBCUTANEOUS | Status: DC
  Administered 2018-08-15 – 2018-08-18 (×4): 40 mg via SUBCUTANEOUS
  Filled 2018-08-15 (×5): qty 0.4

## 2018-08-15 MED ORDER — VORTIOXETINE HBR 5 MG PO TABS
20.0000 mg | ORAL_TABLET | Freq: Every day | ORAL | Status: DC
  Administered 2018-08-16 – 2018-08-19 (×4): 20 mg via ORAL
  Filled 2018-08-15 (×4): qty 4

## 2018-08-15 MED ORDER — SODIUM CHLORIDE 0.9 % IV SOLN: INTRAVENOUS | Status: DC | PRN

## 2018-08-15 MED ORDER — CLONAZEPAM 0.5 MG PO TABS
0.5000 mg | ORAL_TABLET | Freq: Two times a day (BID) | ORAL | Status: DC | PRN
  Administered 2018-08-18 (×2): 0.5 mg via ORAL
  Filled 2018-08-15 (×2): qty 1

## 2018-08-15 MED ORDER — METOCLOPRAMIDE HCL 5 MG/ML IJ SOLN
10.0000 mg | Freq: Once | INTRAMUSCULAR | Status: AC
  Administered 2018-08-15: 10 mg via INTRAVENOUS
  Filled 2018-08-15: qty 2

## 2018-08-15 MED ORDER — SUMATRIPTAN SUCCINATE 25 MG PO TABS
25.0000 mg | ORAL_TABLET | ORAL | Status: DC | PRN
  Administered 2018-08-15 – 2018-08-18 (×5): 25 mg via ORAL
  Filled 2018-08-15 (×9): qty 1

## 2018-08-15 MED ORDER — SERTRALINE HCL 100 MG PO TABS
100.0000 mg | ORAL_TABLET | Freq: Every day | ORAL | Status: DC
  Administered 2018-08-16 – 2018-08-19 (×4): 100 mg via ORAL
  Filled 2018-08-15 (×4): qty 1

## 2018-08-15 MED ORDER — KCL IN DEXTROSE-NACL 20-5-0.45 MEQ/L-%-% IV SOLN
Freq: Once | INTRAVENOUS | Status: AC
  Administered 2018-08-15: 16:00:00 via INTRAVENOUS
  Filled 2018-08-15 (×2): qty 1000

## 2018-08-15 MED ORDER — DIPHENHYDRAMINE HCL 25 MG PO CAPS: 25.0000 mg | ORAL_CAPSULE | Freq: Every day | ORAL | Status: DC | PRN

## 2018-08-15 MED ORDER — FLUTICASONE PROPIONATE 50 MCG/ACT NA SUSP
1.0000 | Freq: Every day | NASAL | Status: DC
  Administered 2018-08-17: 1 via NASAL
  Filled 2018-08-15: qty 16

## 2018-08-15 MED ORDER — LACTATED RINGERS IV BOLUS
1000.0000 mL | Freq: Once | INTRAVENOUS | Status: AC
  Administered 2018-08-15: 1000 mL via INTRAVENOUS

## 2018-08-15 MED ORDER — TRAZODONE HCL 100 MG PO TABS
100.0000 mg | ORAL_TABLET | Freq: Every day | ORAL | Status: DC
  Administered 2018-08-15 – 2018-08-18 (×4): 100 mg via ORAL
  Filled 2018-08-15 (×4): qty 1

## 2018-08-15 MED ORDER — SODIUM CHLORIDE 0.9% FLUSH: 10.0000 mL | INTRAVENOUS | Status: DC | PRN

## 2018-08-15 MED ORDER — HEPARIN SOD (PORK) LOCK FLUSH 100 UNIT/ML IV SOLN: 500.0000 [IU] | INTRAVENOUS | Status: DC | PRN

## 2018-08-15 NOTE — ED Notes (Signed)
ED TO INPATIENT HANDOFF REPORT  Name/Age/Gender Stacey Simpson 42 y.o. female  Code Status   Home/SNF/Other Home  Chief Complaint dehydration  Level of Care/Admitting Diagnosis ED Disposition    ED Disposition Condition [facility] Area: [facility] [[id number]]  Level of Care: Med-Surg [16]  Diagnosis: Short gut syndrome [852778]  Admitting Physician: Florencia Reasons [[id number]]  Attending Physician: Florencia Reasons [[id number]]  PT Class (Do Not Modify): Observation [104]  PT Acc Code (Do Not Modify): Observation [10022]       Medical History Past Medical History:  Diagnosis Date  . Anxiety   . Crohn's disease ([place])   . Herpes   . Hypokalemia   . Insomnia   . Lyme disease   . Major depressive disorder   . Migraines   . Short bowel syndrome   . Tachycardia   . Vitamin B12 deficiency     Allergies No Known Allergies  IV Location/Drains/Wounds Patient Lines/Drains/Airways Status   Active Line/Drains/Airways    Name:   Placement date:   Placement time:   Site:   Days:   Implanted Port 08/05/18 Right Chest   08/05/18    -    Chest   10   Peripheral IV 06/04/18 Right Hand   06/04/18    1527    Hand   72   Incision (Closed) 06/15/18 Other (Comment) Left   06/15/18    1530     61          Labs/Imaging Results for orders placed or performed during the hospital encounter of 08/15/18 (from the past 48 hour(s))  CBC with Differential/Platelet     Status: Abnormal   Collection Time: 08/15/18 11:33 AM  Result Value Ref Range   WBC 9.4 4.0 - 10.5 K/uL   RBC 4.05 3.87 - 5.11 MIL/uL   Hemoglobin 11.9 (L) 12.0 - 15.0 g/dL   HCT 38.1 36.0 - 46.0 %   MCV 94.1 80.0 - 100.0 fL   MCH 29.4 26.0 - 34.0 pg   MCHC 31.2 30.0 - 36.0 g/dL   RDW 13.2 11.5 - 15.5 %   Platelets 381 150 - 400 K/uL   nRBC 0.0 0.0 - 0.2 %   Neutrophils Relative % 59 %   Neutro Abs 5.6 1.7 - 7.7 K/uL   Lymphocytes Relative 33 %   Lymphs Abs 3.1 0.7 - 4.0 K/uL   Monocytes  Relative 6 %   Monocytes Absolute 0.5 0.1 - 1.0 K/uL   Eosinophils Relative 2 %   Eosinophils Absolute 0.2 0.0 - 0.5 K/uL   Basophils Relative 0 %   Basophils Absolute 0.0 0.0 - 0.1 K/uL   Immature Granulocytes 0 %   Abs Immature Granulocytes 0.03 0.00 - 0.07 K/uL    Comment: Performed at [facility], [place] [address] [place], [place] [address]  Comprehensive metabolic panel     Status: Abnormal   Collection Time: 08/15/18 11:33 AM  Result Value Ref Range   Sodium 139 135 - 145 mmol/L   Potassium 3.7 3.5 - 5.1 mmol/L   Chloride 110 98 - 111 mmol/L   CO2 22 22 - 32 mmol/L   Glucose, Bld 87 70 - 99 mg/dL   BUN 10 6 - 20 mg/dL   Creatinine, Ser 1.18 (H) 0.44 - 1.00 mg/dL   Calcium 8.7 (L) 8.9 - 10.3 mg/dL   Total Protein 6.4 (L) 6.5 - 8.1 g/dL   Albumin 3.3 (L) 3.5 -  5.0 g/dL   AST 14 (L) 15 - 41 U/L   ALT 10 0 - 44 U/L   Alkaline Phosphatase 91 38 - 126 U/L   Total Bilirubin 0.1 (L) 0.3 - 1.2 mg/dL   GFR calc non Af Amer 56 (L) >60 mL/min   GFR calc Af Amer >60 >60 mL/min   Anion gap 7 5 - 15    Comment: Performed at [facility], [place] [address] [place], [place] [address]  Magnesium     Status: None   Collection Time: 08/15/18 11:33 AM  Result Value Ref Range   Magnesium 2.1 1.7 - 2.4 mg/dL    Comment: Performed at [facility], [place] [address] [place],  [address]   No results found. None  Pending Labs Unresulted Labs (From admission, onward)    Start     Ordered   08/16/18 0500  CBC with Differential/Platelet  Tomorrow morning,   R     08/15/18 1746   08/16/18 6811  Basic metabolic panel  Tomorrow morning,   R     08/15/18 1746   08/16/18 0500  Magnesium  Tomorrow morning,   R     08/15/18 1746          Vitals/Pain Today's Vitals   08/15/18 1514 08/15/18 1516 08/15/18 1618 08/15/18 1750  BP:    128/80  Pulse:    76  Resp:      Temp:   98.1 F (36.7 C)   TempSrc:   Oral   SpO2:    98%   Weight:  65 kg    Height:  5' 6"  (1.676 m)    PainSc: 0-No pain       Isolation Precautions No active isolations  Medications Medications  dextrose 5 % and 0.45 % NaCl with KCl 20 mEq/L infusion ( Intravenous New Bag/Given 08/15/18 1622)  metoCLOPramide (REGLAN) injection 10 mg (10 mg Intravenous Given 08/15/18 1622)    Mobility walks

## 2018-08-15 NOTE — Telephone Encounter (Signed)
Pt states she left ED Friday night before being treated. She is asking what she should do since she did not have infusions done asking if there is way for her to not have to go through ED process of being admitted to receive infusions. States she has fluid infusion scheduled for today.

## 2018-08-15 NOTE — ED Provider Notes (Signed)
[facility] DEPT Provider Note   CSN: [id number] Arrival date & time: 08/15/18  1455     History   Chief Complaint Chief Complaint  Patient presents with  . Dehydration    HPI Stacey Simpson is a 42 y.o. female.  The history is provided by the patient and medical records. No language interpreter was used.   Stacey Simpson is a 42 y.o. female who presents to the Emergency Department complaining of dehydration.  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  She presents to the ED at the referral of the infusion center for admission for IV fluids.  She has an ileostomy and drains her 600cc pouch 20+ times daily.  This has been a chronic problem but worsening over time.  She has abdominal cramping whenever she eats.  Denies fevers, chills, vomiting.  She routinely has infusions for IVF hydration twice weekly.  She was referred to the ED three days ago for IVF and admission and left due to inability to get her port accessed. Today when she went for IVF she had to empty her ostomy pouch of more volume than was given in her fluids.  She reports current migraine headache, hx/o same.  Sxs are moderate, constant, worsening.   Past Medical History:  Diagnosis Date  . Anxiety   . Crohn's disease ([place])   . Herpes   . Hypokalemia   . Insomnia   . Lyme disease   . Major depressive disorder   . Migraines   . Short bowel syndrome   . Tachycardia   . Vitamin B12 deficiency     Patient Active Problem List   Diagnosis Date Noted  . 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 Anxiety 06/15/2018  . Rheumatoid arthritis ([place]) 06/15/2018  . Malabsorption 06/15/2018  . Osteopenia 06/15/2018  . Hypercholesteremia 06/15/2018  . Chronic diarrhea 06/15/2018  . Crohn's disease of both small and large intestine with other complication ([place]) 18/29/9371  . Cyclical vomiting  with nausea 02/18/2018  . Major depression, recurrent ([place]) 07/17/2017  . Tobacco use disorder 04/21/2017  . Malnutrition ([place]) 03/08/2017  . Loss of appetite 03/08/2017  . Palpitations 04/30/2016  . Headache, chronic daily 10/19/2014  . Carpal tunnel syndrome, bilateral 12/15/2012    Past Surgical History:  Procedure Laterality Date  . ABDOMINAL HYSTERECTOMY    . APPENDECTOMY  1990s  . COLOSTOMY  2017   Dr Carmell Austria, [place], [place]  . IR CV LINE INJECTION  08/03/2018  . PORTACATH PLACEMENT Left 06/15/2018   Procedure: INSERTION PORT-A-CATH UNDER ULTRASOUND AND FLUROSCOPIC GUIDANCE;  Surgeon: Michael Boston, MD;  Location: [facility];  Service: General;  Laterality: Left;  . RIGHT COLECTOMY  2015   Dr Carrington Clamp.  Resection of ileocolonic anastomotic stricture.      OB History   No obstetric history on file.      Home Medications    Prior to Admission medications   Medication Sig Start Date End Date Taking? Authorizing Provider  AMBULATORY NON FORMULARY MEDICATION Inject 1,000 mLs into the vein 3 (three) times a week. Medication Name: IV NS with 40 meq Potassium infuse over 3 hours; give 3 times per week for 3 months; port flushes per pharmacy protocol; may flush port on date of order and draw lab work-CBC,CMP,Magnesium; 07/22/18   Jackquline Denmark, MD  clonazePAM (KLONOPIN) 0.5 MG  tablet Take 0.5 mg by mouth 2 (two) times daily as needed for anxiety.  03/29/17   [provider]  diphenhydrAMINE (BENADRYL) 25 MG tablet Take 25 mg by mouth daily as needed for allergies.    [provider]  HYDROXYZINE HCL PO Take 1 tablet by mouth daily as needed (anxiety).    [provider]  Potassium Chloride ER 20 MEQ TBCR Take 20 mEq by mouth daily. Patient not taking: Reported on 08/13/2018 07/26/18   Jackquline Denmark, MD  promethazine (PHENERGAN) 25 MG tablet Take 1 tablet (25 mg total) by mouth every 8 (eight) hours as needed for nausea or  vomiting. 06/20/18   Jackquline Denmark, MD  QNASL 80 MCG/ACT AERS Place 1 spray into the nose as needed (congestion).  06/20/18   [provider]  sertraline (ZOLOFT) 100 MG tablet Take 100 mg by mouth daily at 12 noon.  05/26/18   [provider]  SUMAtriptan (IMITREX) 25 MG tablet Take 25 mg by mouth as needed for migraine.     [provider]  traZODone (DESYREL) 100 MG tablet Take 100-200 mg by mouth at bedtime.     [provider]  vortioxetine HBr (TRINTELLIX) 20 MG TABS Take 20 mg by mouth daily at 12 noon.  07/23/16   [provider]    Family History Family History  Adopted: Yes  Family history unknown: Yes    Social History Social History   Tobacco Use  . Smoking status: Current Every Day Smoker    Packs/day: 0.25    Types: Cigarettes  . Smokeless tobacco: Never Used  Substance Use Topics  . Alcohol use: No  . Drug use: No     Allergies   Patient has no known allergies.   Review of Systems Review of Systems  All other systems reviewed and are negative.    Physical Exam Updated Vital Signs BP 131/78 (BP Location: Right Arm)   Pulse (!) 101   Temp 97.8 F (36.6 C) (Oral)   Resp 18   Ht 5' 6"  (1.676 m)   Wt 65 kg   SpO2 100%   BMI 23.13 kg/m   Physical Exam Vitals signs and nursing note reviewed.  Constitutional:      Appearance: She is well-developed.  HENT:     Head: Normocephalic and atraumatic.  Cardiovascular:     Rate and Rhythm: Regular rhythm.     Heart sounds: No murmur.     Comments: tachycardic Pulmonary:     Effort: Pulmonary effort is normal. No respiratory distress.     Breath sounds: Normal breath sounds.  Abdominal:     Palpations: Abdomen is soft.     Tenderness: There is no guarding or rebound.     Comments: Ostomy pouch in the LLQ, mild RLQ tenderness  Musculoskeletal:        General: No swelling or tenderness.  Skin:    General: Skin is warm and dry.  Neurological:     Mental  Status: She is alert and oriented to person, place, and time.  Psychiatric:        Behavior: Behavior normal.     Comments: tearful      ED Treatments / Results  Labs (all labs ordered are listed, but only abnormal results are displayed) Labs Reviewed - No data to display  EKG None  Radiology No results found.  Procedures Procedures (including critical care time)  Medications Ordered in ED Medications  dextrose 5 % and 0.45 %  NaCl with KCl 20 mEq/L infusion (has no administration in time range)  metoCLOPramide (REGLAN) injection 10 mg (has no administration in time range)     Initial Impression / Assessment and Plan / ED Course  I have reviewed the triage vital signs and the nursing notes.  Pertinent labs & imaging results that were available during my care of the patient were reviewed by me and considered in my medical decision making (see chart for details).    Patient with history of short gut syndrome here for evaluation of dehydration. She has high ostomy output. Labs reviewed from earlier today, similar to her baseline. She is very symptomatic, tachycardic on ED arrival. Will treat her headache with Reglan, IV fluid administration for dehydration. Medicine consulted for admission for IV fluids. Final Clinical Impressions(s) / ED Diagnoses   Final diagnoses:  None    ED Discharge Orders    None       Quintella Reichert, MD 08/15/18 2037

## 2018-08-15 NOTE — Telephone Encounter (Addendum)
Called and spoke with Richardson Landry, RN at [facility]-advised RN that doc of day gave verbal order for patient to keep port-a-cath access until next infusion day; patient then requested Richardson Landry, RN to ask if she needed to go to the ER now that she "has finally been able to be accessed"- advised RN unless patient is seen in office the MD will not be able to direct admit the patient-if the patient feels she needs to be seen in the ER like the doc of the day ordered for her on Friday-patient left AMA due to unable to be accessed by ED staff-patient is being escorted over to ER by [facility] staff with port-a-cath remaining in place for IV fluids;  Doc of the Day at office (Dr. Henrene Pastor) ordered for patient to stay accessed until next infusion at [facility] due to the difficulty to which the patient is to be accessed;  Informed Richardson Landry, RN to inform patient to call back to office if questions/concerns arise; Patient verbalized understanding of information/instructions via information given to RN from Richardson Landry, [place];

## 2018-08-15 NOTE — Progress Notes (Signed)
Spoke with staff at [facility] regarding patient.  Since they were unable to access port in the ED Friday they advised for [place] to take the patient to the ED now while she was accessed so she could be evaluated for admission.  Called ED charge nurse to notify them of situation.  Patient taken to ED via wheelchair with RN present.

## 2018-08-15 NOTE — Telephone Encounter (Signed)
Stacey Simpson from [facility] pt care requesting a CB about pt.

## 2018-08-15 NOTE — ED Triage Notes (Signed)
Patient with hx of crohns disease and ostomy with increased output complaining of dehydration. Patient was seen at [facility] 3 days ago and was diagnosed with dehydration. Patient reports since going home, she has only gotten worse. Patient went to infusion clinic this morning due to abnormal labs at PCP. Patient brought to ED for evaluation and admission.

## 2018-08-15 NOTE — H&P (Signed)
History and Physical  Stacey Simpson HUT:[id number] DOB: 11-20-1975 DOA: 08/15/2018  Referring physician: EDP PCP: Stacey Radar, NP   Chief Complaint: Dehydration, increased ostomy output  HPI: Stacey Simpson is a 42 y.o. female   History of chronic migraine headaches, chronic Lyme's disease,  CKDII, Crohn's status post chronic colostomy, short gut syndrome require fluids transfusion 2-3 times per week sent from GI clinic for admission due to  Increased ostomy output, dehydration.  She has no fever, denies of abdominal pain.  No nausea no vomiting .  ED course: Initial on presentation she has slight sinus tachycardia, blood pressure stable, no fever.  Basic lab work at baseline.  She is given hydration, 1 dose of Reglan for chronic headache, hospitalist called to further manage the patient.  Review of Systems:  Detail per HPI, Review of systems are otherwise negative  Past Medical History:  Diagnosis Date  . Anxiety   . Crohn's disease ([place])   . Herpes   . Hypokalemia   . Insomnia   . Lyme disease   . Major depressive disorder   . Migraines   . Short bowel syndrome   . Tachycardia   . Vitamin B12 deficiency    Past Surgical History:  Procedure Laterality Date  . ABDOMINAL HYSTERECTOMY    . APPENDECTOMY  1990s  . COLOSTOMY  2017   Dr Carmell Austria, [place], [place]  . IR CV LINE INJECTION  08/03/2018  . PORTACATH PLACEMENT Left 06/15/2018   Procedure: INSERTION PORT-A-CATH UNDER ULTRASOUND AND FLUROSCOPIC GUIDANCE;  Surgeon: Michael Boston, MD;  Location: [facility];  Service: General;  Laterality: Left;  . RIGHT COLECTOMY  2015   Dr Carrington Clamp.  Resection of ileocolonic anastomotic stricture.    Social History:  reports that she has been smoking cigarettes. She has been smoking about 0.25 packs per day. She has never used smokeless tobacco. She reports that she does not drink alcohol or use drugs. Patient lives at home & is able to  participate in activities of daily living independently   No Known Allergies  Family History  Adopted: Yes  Family history unknown: Yes      Prior to Admission medications   Medication Sig Start Date End Date Taking? Authorizing Provider  AMBULATORY NON FORMULARY MEDICATION Inject 1,000 mLs into the vein 3 (three) times a week. Medication Name: IV NS with 40 meq Potassium infuse over 3 hours; give 3 times per week for 3 months; port flushes per pharmacy protocol; may flush port on date of order and draw lab work-CBC,CMP,Magnesium; 07/22/18  Yes Jackquline Denmark, MD  clonazePAM (KLONOPIN) 0.5 MG tablet Take 0.5 mg by mouth 2 (two) times daily as needed for anxiety.  03/29/17  Yes [provider]  diphenhydrAMINE (BENADRYL) 25 MG tablet Take 25 mg by mouth daily as needed for allergies.   Yes [provider]  promethazine (PHENERGAN) 25 MG tablet Take 1 tablet (25 mg total) by mouth every 8 (eight) hours as needed for nausea or vomiting. 06/20/18  Yes Jackquline Denmark, MD  sertraline (ZOLOFT) 100 MG tablet Take 100 mg by mouth daily at 12 noon.  05/26/18  Yes [provider]  SUMAtriptan (IMITREX) 25 MG tablet Take 25 mg by mouth as needed for migraine.    Yes [provider]  traZODone (DESYREL) 100 MG tablet Take 100 mg by mouth at bedtime.    Yes [provider]  vortioxetine HBr (TRINTELLIX) 20  MG TABS Take 20 mg by mouth daily at 12 noon.  07/23/16  Yes [provider]  Potassium Chloride ER 20 MEQ TBCR Take 20 mEq by mouth daily. Patient not taking: Reported on 08/13/2018 07/26/18   Jackquline Denmark, MD  QNASL 80 MCG/ACT AERS Place 1 spray into the nose as needed (congestion).  06/20/18   [provider]    Physical Exam: BP 131/78 (BP Location: Right Arm)   Pulse (!) 101   Temp 98.1 F (36.7 C) (Oral)   Resp 18   Ht 5' 6"  (1.676 m)   Wt 65 kg   SpO2 100%   BMI 23.13 kg/m   General:  NAD Eyes: PERRL ENT: unremarkable Neck:  supple, no JVD Cardiovascular: RRR Respiratory: CTABL Abdomen: soft/NT/ND, positive bowel sounds, + ostomy Skin: no rash Musculoskeletal:  No edema Psychiatric: calm/cooperative Neurologic: no focal findings            Labs on Admission:  Basic Metabolic Panel: Recent Labs  Lab 08/15/18 1133  NA 139  K 3.7  CL 110  CO2 22  GLUCOSE 87  BUN 10  CREATININE 1.18*  CALCIUM 8.7*  MG 2.1   Liver Function Tests: Recent Labs  Lab 08/15/18 1133  AST 14*  ALT 10  ALKPHOS 91  BILITOT 0.1*  PROT 6.4*  ALBUMIN 3.3*   No results for input(s): LIPASE, AMYLASE in the last 168 hours. No results for input(s): AMMONIA in the last 168 hours. CBC: Recent Labs  Lab 08/15/18 1133  WBC 9.4  NEUTROABS 5.6  HGB 11.9*  HCT 38.1  MCV 94.1  PLT 381   Cardiac Enzymes: No results for input(s): CKTOTAL, CKMB, CKMBINDEX, TROPONINI in the last 168 hours.  BNP (last 3 results) No results for input(s): BNP in the last 8760 hours.  ProBNP (last 3 results) No results for input(s): PROBNP in the last 8760 hours.  CBG: No results for input(s): GLUCAP in the last 168 hours.  Radiological Exams on Admission: No results found.    Assessment/Plan Present on Admission: . Short gut syndrome  H/o Crohn's disease/short gut syndrome Increase ostomy output , dehydration -continue hydration, supportive care -she reports GI is in the process to start her on gattex, will defer to GI   DVT prophylaxis: lovenox  Consultants:  Patient is sent to ED per [facility] instruction Need to notify [facility] in am about admission  Code Status: full   Family Communication:  Patient and husband at bedside  Disposition Plan: med surg obs  Time spent: 22mns  FFlorencia ReasonsMD, PhD [facility] Pager [phone] 7PM-7AM, please contact night-coverage at [email or web address], password [email or web address]

## 2018-08-15 NOTE — ED Notes (Signed)
Bed: [facility] Expected date:  Expected time:  Means of arrival:  Comments: Patient in bed

## 2018-08-15 NOTE — ED Notes (Signed)
Report called to 3W RN.

## 2018-08-15 NOTE — Progress Notes (Signed)
Patient was complaining of burning at the port site, IV fluids rate was lowered to 34m/hr. After few miniute patient stated that she is feeling better. On Call MD is paged. Will monitor.

## 2018-08-15 NOTE — Telephone Encounter (Signed)
Seeing as how this patient left from the ED as AMA-is there anything you feel that needs to be done for this patient at this time?

## 2018-08-15 NOTE — Progress Notes (Signed)
PATIENT CARE CENTER NOTE  Diagnosis:Short gut syndrome/malabsorption   Provider:Dr. Jackquline Denmark   Procedure:1 liter bolus of Lactated Ringers   Note:Patient received 1 liter bolus of LR through right chest PAC. States she still feels bad - lab work for this week obtained and awaiting results to call to MD office for further instruction.

## 2018-08-15 NOTE — Discharge Instructions (Signed)
Patient received 1 liter bolus of fluids (Lactated Ringer) and labs were drawn from Port-a-cath.      Dehydration, Adult  Dehydration is when there is not enough fluid or water in your body. This happens when you lose more fluids than you take in. Dehydration can range from mild to very bad. It should be treated right away to keep it from getting very bad. Symptoms of mild dehydration may include:  Thirst.  Dry lips.  Slightly dry mouth.  Dry, warm skin.  Dizziness. Symptoms of moderate dehydration may include:  Very dry mouth.  Muscle cramps.  Dark pee (urine). Pee may be the color of tea.  Your body making less pee.  Your eyes making fewer tears.  Heartbeat that is uneven or faster than normal (palpitations).  Headache.  Light-headedness, especially when you stand up from sitting.  Fainting (syncope). Symptoms of very bad dehydration may include:  Changes in skin, such as: ? Cold and clammy skin. ? Blotchy (mottled) or pale skin. ? Skin that does not quickly return to normal after being lightly pinched and let go (poor skin turgor).  Changes in body fluids, such as: ? Feeling very thirsty. ? Your eyes making fewer tears. ? Not sweating when body temperature is high, such as in hot weather. ? Your body making very little pee.  Changes in vital signs, such as: ? Weak pulse. ? Pulse that is more than 100 beats a minute when you are sitting still. ? Fast breathing. ? Low blood pressure.  Other changes, such as: ? Sunken eyes. ? Cold hands and feet. ? Confusion. ? Lack of energy (lethargy). ? Trouble waking up from sleep. ? Short-term weight loss. ? Unconsciousness. Follow these instructions at home:   If told by your doctor, drink an ORS: ? Make an ORS by using instructions on the package. ? Start by drinking small amounts, about  cup (120 mL) every 5-10 minutes. ? Slowly drink more until you have had the amount that your doctor said to  have.  Drink enough clear fluid to keep your pee clear or pale yellow. If you were told to drink an ORS, finish the ORS first, then start slowly drinking clear fluids. Drink fluids such as: ? Water. Do not drink only water by itself. Doing that can make the salt (sodium) level in your body get too low (hyponatremia). ? Ice chips. ? Fruit juice that you have added water to (diluted). ? Low-calorie sports drinks.  Avoid: ? Alcohol. ? Drinks that have a lot of sugar. These include high-calorie sports drinks, fruit juice that does not have water added, and soda. ? Caffeine. ? Foods that are greasy or have a lot of fat or sugar.  Take over-the-counter and prescription medicines only as told by your doctor.  Do not take salt tablets. Doing that can make the salt level in your body get too high (hypernatremia).  Eat foods that have minerals (electrolytes). Examples include bananas, oranges, potatoes, tomatoes, and spinach.  Keep all follow-up visits as told by your doctor. This is important. Contact a doctor if:  You have belly (abdominal) pain that: ? Gets worse. ? Stays in one area (localizes).  You have a rash.  You have a stiff neck.  You get angry or annoyed more easily than normal (irritability).  You are more sleepy than normal.  You have a harder time waking up than normal.  You feel: ? Weak. ? Dizzy. ? Very thirsty.  You have peed (  urinated) only a small amount of very dark pee during 6-8 hours. Get help right away if:  You have symptoms of very bad dehydration.  You cannot drink fluids without throwing up (vomiting).  Your symptoms get worse with treatment.  You have a fever.  You have a very bad headache.  You are throwing up or having watery poop (diarrhea) and it: ? Gets worse. ? Does not go away.  You have blood or something green (bile) in your throw-up.  You have blood in your poop (stool). This may cause poop to look black and tarry.  You have  not peed in 6-8 hours.  You pass out (faint).  Your heart rate when you are sitting still is more than 100 beats a minute.  You have trouble breathing. This information is not intended to replace advice given to you by your health care provider. Make sure you discuss any questions you have with your health care provider. Document Released: 05/30/2009 Document Revised: 02/21/2016 Document Reviewed: 09/27/2015 Elsevier Interactive Patient Education  2019 [employer].

## 2018-08-16 ENCOUNTER — Encounter (HOSPITAL_COMMUNITY): Payer: Managed Care, Other (non HMO)

## 2018-08-16 DIAGNOSIS — K912 Postsurgical malabsorption, not elsewhere classified: Secondary | ICD-10-CM | POA: Diagnosis not present

## 2018-08-16 DIAGNOSIS — Z433 Encounter for attention to colostomy: Secondary | ICD-10-CM | POA: Diagnosis not present

## 2018-08-16 DIAGNOSIS — K50918 Crohn's disease, unspecified, with other complication: Secondary | ICD-10-CM | POA: Diagnosis not present

## 2018-08-16 LAB — CBC WITH DIFFERENTIAL/PLATELET
ABS IMMATURE GRANULOCYTES: 0.02 10*3/uL (ref 0.00–0.07)
Basophils Absolute: 0 10*3/uL (ref 0.0–0.1)
Basophils Relative: 1 %
Eosinophils Absolute: 0.2 10*3/uL (ref 0.0–0.5)
Eosinophils Relative: 2 %
HCT: 38.5 % (ref 36.0–46.0)
HEMOGLOBIN: 12.2 g/dL (ref 12.0–15.0)
Immature Granulocytes: 0 %
Lymphocytes Relative: 46 %
Lymphs Abs: 3.8 10*3/uL (ref 0.7–4.0)
MCH: 29.5 pg (ref 26.0–34.0)
MCHC: 31.7 g/dL (ref 30.0–36.0)
MCV: 93.2 fL (ref 80.0–100.0)
MONOS PCT: 5 %
Monocytes Absolute: 0.4 10*3/uL (ref 0.1–1.0)
NEUTROS ABS: 3.7 10*3/uL (ref 1.7–7.7)
Neutrophils Relative %: 46 %
Platelets: 360 10*3/uL (ref 150–400)
RBC: 4.13 MIL/uL (ref 3.87–5.11)
RDW: 13.5 % (ref 11.5–15.5)
WBC: 8.2 10*3/uL (ref 4.0–10.5)
nRBC: 0 % (ref 0.0–0.2)

## 2018-08-16 LAB — C DIFFICILE QUICK SCREEN W PCR REFLEX
C Diff antigen: NEGATIVE
C Diff interpretation: NOT DETECTED
C Diff toxin: NEGATIVE

## 2018-08-16 LAB — BASIC METABOLIC PANEL
Anion gap: 6 (ref 5–15)
BUN: 6 mg/dL (ref 6–20)
CHLORIDE: 113 mmol/L — AB (ref 98–111)
CO2: 22 mmol/L (ref 22–32)
Calcium: 8.9 mg/dL (ref 8.9–10.3)
Creatinine, Ser: 1.07 mg/dL — ABNORMAL HIGH (ref 0.44–1.00)
GFR calc Af Amer: >60 mL/min (ref >60)
GFR calc non Af Amer: >60 mL/min (ref >60)
Glucose, Bld: 93 mg/dL (ref 70–99)
Potassium: 3.7 mmol/L (ref 3.5–5.1)
Sodium: 141 mmol/L (ref 135–145)

## 2018-08-16 LAB — C-REACTIVE PROTEIN: CRP: <0.8 mg/dL (ref <1.0)

## 2018-08-16 LAB — HIV ANTIBODY (ROUTINE TESTING W REFLEX): HIV Screen 4th Generation wRfx: NONREACTIVE

## 2018-08-16 LAB — TSH: TSH: 1.168 u[IU]/mL (ref 0.350–4.500)

## 2018-08-16 LAB — MAGNESIUM: Magnesium: 2 mg/dL (ref 1.7–2.4)

## 2018-08-16 LAB — SEDIMENTATION RATE: SED RATE: 25 mm/h — AB (ref 0–22)

## 2018-08-16 MED ORDER — METOCLOPRAMIDE HCL 5 MG/ML IJ SOLN
5.0000 mg | Freq: Three times a day (TID) | INTRAMUSCULAR | Status: DC | PRN
  Administered 2018-08-16 – 2018-08-17 (×3): 5 mg via INTRAVENOUS
  Filled 2018-08-16 (×3): qty 2

## 2018-08-16 MED ORDER — KCL IN DEXTROSE-NACL 20-5-0.45 MEQ/L-%-% IV SOLN
INTRAVENOUS | Status: AC
  Administered 2018-08-16 – 2018-08-18 (×4): via INTRAVENOUS
  Filled 2018-08-16 (×4): qty 1000

## 2018-08-16 MED ORDER — SODIUM CHLORIDE 0.9% FLUSH
10.0000 mL | INTRAVENOUS | Status: DC | PRN
  Administered 2018-08-19: 10 mL
  Filled 2018-08-16: qty 40

## 2018-08-16 MED ORDER — SODIUM CHLORIDE 0.9% FLUSH
10.0000 mL | Freq: Two times a day (BID) | INTRAVENOUS | Status: DC
  Administered 2018-08-16 – 2018-08-17 (×2): 10 mL

## 2018-08-16 NOTE — Consult Note (Signed)
Consultation  Referring Provider: Dr.Xu Primary Care Physician:  [name], NP Primary Gastroenterologist:   Dr. Lyndel Safe      Reason for Consultation: Short gut syndrome with increased output            HPI:   Stacey Simpson is a 42 y.o. female with a past medical history of Crohn's disease and short bowel syndrome as well as others listed below, follows with Dr. Lyndel Safe and presented to the ER 08/15/2018 with a complaint of dehydration and increased ostomy output.    Today, patient explains that she typically requires fluid transfusion 2-3 times per week due to dehydration issues, but experienced increased ostomy output over the past week and was told to present to the ER. Tells me this was more than 20x per day and sometimes she would be in the bathroom and just empty her ostomy and it would fill back up again.  Goes on to explain she feels like this has slowed over the past day or so but she has also not eaten much at all.  Describes that in general this is just hindering her life noting that her husband has to set his alarm every 2-3 hours in the night, in order to empty her ostomy so that it does not "explode".  Patient tells me she only eats between 3-6 pm because typically they are able to empty the ostomy after this and it is not as bad while she is sleeping.  Also has cyclical vomiting and describes chronic hunger pains rated as a 4/10 as well as chronic headache stopped with Imitrex.  Patient is just wanting to have a plan before leaving the hospital.    Denies fever, chills or blood in her stool.  ER course: Sinus tachycardia, blood pressure stable, basic labs at baseline, given hydration and 1 dose of Reglan for chronic headache  Most recent GI history: 08/12/2018 telephone call with Dr. Rush Landmark: Described significant output increase, at that time recommended she go to the ER recommendations for stool studies, awaiting GI IBD consult at [facility], recommended CBC, CMP,  ESR, CRP, TSH with reflex and stool studies include C. difficile, question of need for imaging 07/26/2018 office visit Dr. 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 Thana Farr) 02/21/2018 (and the EGD,: Normal needle-TI and colon, negative biopsies (, CT 02/2018 with skip lesions but minimal inflammation, also cyclical nausea/vomiting, comorbid conditions include anxiety and depression; plan: Surgery Dr. Johney Maine suggested IR consultation for port check/flush, started on K-Dur 20 mEq p.o. daily, instructed to quit smoking, call follow-up appointment with Dr. Koleen Distance [facility] IBD clinic, trial of Lomotil 3/day and if still problems trial cholestyramine 4 g p.o. twice daily, discussed that if still has problems with consider 48-72-hour stool collection and investigations, Gattex discussion  Past Medical History:  Diagnosis Date  . Anxiety   . Crohn's disease ([place])   . Herpes   . Hypokalemia   . Insomnia   . Lyme disease   . Major depressive disorder   . Migraines   . Short bowel syndrome   . Tachycardia   . Vitamin B12  deficiency     Past Surgical History:  Procedure Laterality Date  . ABDOMINAL HYSTERECTOMY    . APPENDECTOMY  1990s  . COLOSTOMY  2017   Dr Carmell Austria, [place], [place]  . IR CV LINE INJECTION  08/03/2018  . PORTACATH PLACEMENT Left 06/15/2018   Procedure: INSERTION PORT-A-CATH UNDER ULTRASOUND AND FLUROSCOPIC GUIDANCE;  Surgeon: Michael Boston, MD;  Location: [facility] ORS;  Service: General;  Laterality: Left;  . RIGHT COLECTOMY  2015   Dr Carrington Clamp.   Resection of ileocolonic anastomotic stricture.     Family History  Adopted: Yes  Family history unknown: Yes    Social History   Tobacco Use  . Smoking status: Current Every Day Smoker    Packs/day: 0.25    Types: Cigarettes  . Smokeless tobacco: Never Used  Substance Use Topics  . Alcohol use: No  . Drug use: No    Prior to Admission medications   Medication Sig Start Date End Date Taking? Authorizing Provider  AMBULATORY NON FORMULARY MEDICATION Inject 1,000 mLs into the vein 3 (three) times a week. Medication Name: IV NS with 40 meq Potassium infuse over 3 hours; give 3 times per week for 3 months; port flushes per pharmacy protocol; may flush port on date of order and draw lab work-CBC,CMP,Magnesium; 07/22/18  Yes Jackquline Denmark, MD  clonazePAM (KLONOPIN) 0.5 MG tablet Take 0.5 mg by mouth 2 (two) times daily as needed for anxiety.  03/29/17  Yes [provider]  diphenhydrAMINE (BENADRYL) 25 MG tablet Take 25 mg by mouth daily as needed for allergies.   Yes [provider]  promethazine (PHENERGAN) 25 MG tablet Take 1 tablet (25 mg total) by mouth every 8 (eight) hours as needed for nausea or vomiting. 06/20/18  Yes Jackquline Denmark, MD  sertraline (ZOLOFT) 100 MG tablet Take 100 mg by mouth daily at 12 noon.  05/26/18  Yes [provider]  SUMAtriptan (IMITREX) 25 MG tablet Take 25 mg by mouth as needed for migraine.    Yes [provider]  traZODone (DESYREL) 100 MG tablet Take 100 mg by mouth at bedtime.    Yes [provider]  vortioxetine HBr (TRINTELLIX) 20 MG TABS Take 20 mg by mouth daily at 12 noon.  07/23/16  Yes [provider]  Potassium Chloride ER 20 MEQ TBCR Take 20 mEq by mouth daily. Patient not taking: Reported on 08/13/2018 07/26/18   Jackquline Denmark, MD  QNASL 80 MCG/ACT AERS Place 1 spray into the nose as needed (congestion).  06/20/18   [provider]    Current Facility-Administered Medications    Medication Dose Route Frequency Provider Last Rate Last Dose  . clonazePAM (KLONOPIN) tablet 0.5 mg  0.5 mg Oral BID PRN Florencia Reasons, MD      . diphenhydrAMINE (BENADRYL) capsule 25 mg  25 mg Oral Daily PRN Florencia Reasons, MD      . enoxaparin (LOVENOX) injection 40 mg  40 mg Subcutaneous Q24H Florencia Reasons, MD   40 mg at 08/15/18 2200  . fluticasone (FLONASE) 50 MCG/ACT nasal spray 1 spray  1 spray Each Nare Daily Florencia Reasons, MD      . promethazine (PHENERGAN) tablet 25 mg  25 mg Oral Q8H PRN Florencia Reasons, MD      . sertraline (ZOLOFT) tablet 100 mg  100 mg Oral Daily Florencia Reasons, MD      . sodium chloride flush (NS) 0.9 % injection 10-40  mL  10-40 mL Intracatheter Q12H Florencia Reasons, MD      . sodium chloride flush (NS) 0.9 % injection 10-40 mL  10-40 mL Intracatheter PRN Florencia Reasons, MD      . SUMAtriptan [facility]) tablet 25 mg  25 mg Oral PRN Florencia Reasons, MD   25 mg at 08/15/18 2227  . traZODone (DESYREL) tablet 100 mg  100 mg Oral QHS Florencia Reasons, MD   100 mg at 08/15/18 2200  . vortioxetine HBr (TRINTELLIX) tablet 20 mg  20 mg Oral Daily Florencia Reasons, MD        Allergies as of 08/15/2018 - Review Complete 08/15/2018  Allergen Reaction Noted  . Doxycycline Nausea And Vomiting 08/15/2018     Review of Systems:    Constitutional: No weight loss, fever or chills Skin: No rash Cardiovascular: No chest pain Respiratory: No SOB  Gastrointestinal: See HPI and otherwise negative Genitourinary: No dysuria  Neurological: No headache, dizziness or syncope Musculoskeletal: No new muscle or joint pain Hematologic: No bleeding  Psychiatric: No history of depression or anxiety    Physical Exam:  Vital signs in last 24 hours: Temp:  [97.7 F (36.5 C)-98.2 F (36.8 C)] 97.7 F (36.5 C) (12/31 0501) Pulse Rate:  [60-101] 60 (12/31 0600) Resp:  [16-18] 18 (12/31 0600) BP: (94-139)/(58-83) 100/77 (12/31 0600) SpO2:  [98 %-100 %] 99 % (12/30 1844) Weight:  [65 kg] 65 kg (12/30 1516) Last BM Date: 08/15/18 General:   Pleasant  Caucasian female appears to be in NAD, Well developed, Well nourished, alert and cooperative Head:  Normocephalic and atraumatic. Eyes:   PEERL, EOMI. No icterus. Conjunctiva pink. Ears:  Normal auditory acuity. Neck:  Supple Throat: Oral cavity and pharynx without inflammation, swelling or lesion. Teeth in good condition. Lungs: Respirations even and unlabored. Lungs clear to auscultation bilaterally.   No wheezes, crackles, or rhonchi.  Heart: Normal S1, S2. No MRG. Regular rate and rhythm. No peripheral edema, cyanosis or pallor.  Abdomen:  Soft, nondistended, nontender. No rebound or guarding. Normal bowel sounds. No appreciable masses or hepatomegaly. Rectal:  Not performed.  Msk:  Symmetrical without gross deformities. Peripheral pulses intact.  Extremities:  Without edema, no deformity or joint abnormality. Normal ROM, normal sensation. Neurologic:  Alert and  oriented x4;  grossly normal neurologically.  Skin:   Dry and intact without significant lesions or rashes. Psychiatric: Demonstrates good judgement and reason without abnormal affect or behaviors.   LAB RESULTS: Recent Labs    08/15/18 1133 08/16/18 0829  WBC 9.4 8.2  HGB 11.9* 12.2  HCT 38.1 38.5  PLT 381 360   BMET Recent Labs    08/15/18 1133 08/16/18 0829  NA 139 141  K 3.7 3.7  CL 110 113*  CO2 22 22  GLUCOSE 87 93  BUN 10 6  CREATININE 1.18* 1.07*  CALCIUM 8.7* 8.9   LFT Recent Labs    08/15/18 1133  PROT 6.4*  ALBUMIN 3.3*  AST 14*  ALT 10  ALKPHOS 91  BILITOT 0.1*     Impression / Plan:   Impression: 1.  Short gut syndrome: History of Crohn's disease, recent increase in ostomy output with dehydration over the past week, plans to start her on Gattex with [facility] IBD consult  Plan: 1. Ordered ESR, CRP and TSH with reflex 2. Ordered stool studies including GI pathogen panel and C. difficile 3.  Discussed with patient I will tried to come up with a long-term plan for her.  Even  if this is  just getting her a consultation with [facility] to further discuss Gattex, before she leaves. 4.  Please await further recommendations from our service later today.   Thank you for your kind consultation, we will continue to follow.  Lavone Nian Jann Milkovich  08/16/2018, 10:21 AM

## 2018-08-16 NOTE — Plan of Care (Signed)
  Problem: Health Behavior/Discharge Planning: Goal: Ability to manage health-related needs will improve Outcome: Progressing   Problem: Clinical Measurements: Goal: Will remain free from infection Outcome: Progressing   Problem: Clinical Measurements: Goal: Respiratory complications will improve Outcome: Progressing   Problem: Clinical Measurements: Goal: Cardiovascular complication will be avoided Outcome: Progressing

## 2018-08-16 NOTE — Progress Notes (Signed)
PROGRESS NOTE  RIKI BERNINGER QRF:[id number] DOB: 1976/02/07 DOA: 08/15/2018 PCP: [name], NP  HPI/Recap of past 24 hours:  Still feel dehydrated and tired, ostomy output still high, denies pain, no fever  Assessment/Plan: Active Problems:   Crohn's disease ([place])   Colostomy care ([place])   Short gut syndrome   Dehydration  H/o Crohn's disease/short gut syndrome Increase ostomy output , dehydration -continue hydration, supportive care -GI input appreciated , will follow GI recommendation  Code Status: full  Family Communication: patient   Disposition Plan: home when clears by GI   Consultants:  LBGI  Procedures:  none  Antibiotics:  none   Objective: BP 116/86 (BP Location: Left Arm)   Pulse 73   Temp 98.6 F (37 C) (Oral)   Resp 18   Ht 5' 6"  (1.676 m)   Wt 65 kg   SpO2 99%   BMI 23.13 kg/m   Intake/Output Summary (Last 24 hours) at 08/16/2018 1545 Last data filed at 08/16/2018 1406 Gross per 24 hour  Intake 1961.96 ml  Output 2400 ml  Net -438.04 ml   Filed Weights   08/15/18 1516  Weight: 65 kg    Exam: Patient is examined daily including today on 08/16/2018, exams remain the same as of yesterday except that has changed    General:  NAD  Cardiovascular: RRR  Respiratory: CTABL  Abdomen: Soft/ND/NT, positive BS, + colostomy   Musculoskeletal: No Edema  Neuro: alert, oriented   Data Reviewed: Basic Metabolic Panel: Recent Labs  Lab 08/15/18 1133 08/16/18 0829  NA 139 141  K 3.7 3.7  CL 110 113*  CO2 22 22  GLUCOSE 87 93  BUN 10 6  CREATININE 1.18* 1.07*  CALCIUM 8.7* 8.9  MG 2.1 2.0   Liver Function Tests: Recent Labs  Lab 08/15/18 1133  AST 14*  ALT 10  ALKPHOS 91  BILITOT 0.1*  PROT 6.4*  ALBUMIN 3.3*   No results for input(s): LIPASE, AMYLASE in the last 168 hours. No results for input(s): AMMONIA in the last 168 hours. CBC: Recent Labs  Lab 08/15/18 1133 08/16/18 0829  WBC 9.4 8.2    NEUTROABS 5.6 3.7  HGB 11.9* 12.2  HCT 38.1 38.5  MCV 94.1 93.2  PLT 381 360   Cardiac Enzymes:   No results for input(s): CKTOTAL, CKMB, CKMBINDEX, TROPONINI in the last 168 hours. BNP (last 3 results) No results for input(s): BNP in the last 8760 hours.  ProBNP (last 3 results) No results for input(s): PROBNP in the last 8760 hours.  CBG: No results for input(s): GLUCAP in the last 168 hours.  Recent Results (from the past 240 hour(s))  C difficile quick scan w PCR reflex     Status: None   Collection Time: 08/16/18 11:10 AM  Result Value Ref Range Status   C Diff antigen NEGATIVE NEGATIVE Final   C Diff toxin NEGATIVE NEGATIVE Final   C Diff interpretation No C. difficile detected.  Final    Comment: Performed at [facility], [place] [address] [place], [place] [address]     Studies: No results found.  Scheduled Meds: . enoxaparin (LOVENOX) injection  40 mg Subcutaneous Q24H  . fluticasone  1 spray Each Nare Daily  . sertraline  100 mg Oral Daily  . sodium chloride flush  10-40 mL Intracatheter Q12H  . traZODone  100 mg Oral QHS  . vortioxetine HBr  20 mg Oral Daily    Continuous Infusions: . dextrose  5 % and 0.45 % NaCl with KCl 20 mEq/L       Time spent: 83mns, case discussed with GI I have personally reviewed and interpreted on  08/16/2018 daily labs, imagings as discussed above under date review session and assessment and plans.  I reviewed all nursing notes, pharmacy notes, consultant notes,  vitals, pertinent old records  I have discussed plan of care as described above with RN , patient  on 08/16/2018   FFlorencia ReasonsMD, PhD  [facility] Pager [phone] If 7PM-7AM, please contact night-coverage at [email or web address], password [email or web address]12/31/2019, 3:45 PM  LOS: 0 days

## 2018-08-17 DIAGNOSIS — E860 Dehydration: Secondary | ICD-10-CM | POA: Diagnosis not present

## 2018-08-17 DIAGNOSIS — K50918 Crohn's disease, unspecified, with other complication: Secondary | ICD-10-CM | POA: Diagnosis not present

## 2018-08-17 DIAGNOSIS — K912 Postsurgical malabsorption, not elsewhere classified: Secondary | ICD-10-CM | POA: Diagnosis not present

## 2018-08-17 DIAGNOSIS — Z433 Encounter for attention to colostomy: Secondary | ICD-10-CM | POA: Diagnosis not present

## 2018-08-17 LAB — PROTIME-INR
INR: 0.89
Prothrombin Time: 12 seconds (ref 11.4–15.2)

## 2018-08-17 LAB — BASIC METABOLIC PANEL
Anion gap: 9 (ref 5–15)
BUN: <5 mg/dL — ABNORMAL LOW (ref 6–20)
CO2: 23 mmol/L (ref 22–32)
Calcium: 9.2 mg/dL (ref 8.9–10.3)
Chloride: 111 mmol/L (ref 98–111)
Creatinine, Ser: 1.21 mg/dL — ABNORMAL HIGH (ref 0.44–1.00)
GFR calc Af Amer: >60 mL/min (ref >60)
GFR calc non Af Amer: 54 mL/min — ABNORMAL LOW (ref >60)
Glucose, Bld: 110 mg/dL — ABNORMAL HIGH (ref 70–99)
POTASSIUM: 4.4 mmol/L (ref 3.5–5.1)
Sodium: 143 mmol/L (ref 135–145)

## 2018-08-17 LAB — CBC WITH DIFFERENTIAL/PLATELET
Abs Immature Granulocytes: 0.05 10*3/uL (ref 0.00–0.07)
Basophils Absolute: 0 10*3/uL (ref 0.0–0.1)
Basophils Relative: 0 %
Eosinophils Absolute: 0.1 10*3/uL (ref 0.0–0.5)
Eosinophils Relative: 1 %
HCT: 38.1 % (ref 36.0–46.0)
Hemoglobin: 11.9 g/dL — ABNORMAL LOW (ref 12.0–15.0)
Immature Granulocytes: 0 %
Lymphocytes Relative: 25 %
Lymphs Abs: 3.6 10*3/uL (ref 0.7–4.0)
MCH: 29.6 pg (ref 26.0–34.0)
MCHC: 31.2 g/dL (ref 30.0–36.0)
MCV: 94.8 fL (ref 80.0–100.0)
MONO ABS: 0.6 10*3/uL (ref 0.1–1.0)
Monocytes Relative: 4 %
NEUTROS ABS: 9.9 10*3/uL — AB (ref 1.7–7.7)
Neutrophils Relative %: 70 %
Platelets: 375 10*3/uL (ref 150–400)
RBC: 4.02 MIL/uL (ref 3.87–5.11)
RDW: 13.4 % (ref 11.5–15.5)
WBC: 14.3 10*3/uL — ABNORMAL HIGH (ref 4.0–10.5)
nRBC: 0 % (ref 0.0–0.2)

## 2018-08-17 LAB — GASTROINTESTINAL PANEL BY PCR, STOOL (REPLACES STOOL CULTURE)
Adenovirus F40/41: NOT DETECTED
Astrovirus: NOT DETECTED
CRYPTOSPORIDIUM: NOT DETECTED
Campylobacter species: NOT DETECTED
Cyclospora cayetanensis: NOT DETECTED
ENTEROPATHOGENIC E COLI (EPEC): NOT DETECTED
Entamoeba histolytica: NOT DETECTED
Enteroaggregative E coli (EAEC): NOT DETECTED
Enterotoxigenic E coli (ETEC): NOT DETECTED
Giardia lamblia: NOT DETECTED
Norovirus GI/GII: NOT DETECTED
Plesimonas shigelloides: NOT DETECTED
ROTAVIRUS A: NOT DETECTED
Salmonella species: NOT DETECTED
Sapovirus (I, II, IV, and V): NOT DETECTED
Shiga like toxin producing E coli (STEC): NOT DETECTED
Shigella/Enteroinvasive E coli (EIEC): NOT DETECTED
Vibrio cholerae: NOT DETECTED
Vibrio species: NOT DETECTED
Yersinia enterocolitica: NOT DETECTED

## 2018-08-17 LAB — MAGNESIUM: MAGNESIUM: 2 mg/dL (ref 1.7–2.4)

## 2018-08-17 MED ORDER — ONDANSETRON HCL 4 MG/2ML IJ SOLN
4.0000 mg | Freq: Four times a day (QID) | INTRAMUSCULAR | Status: DC | PRN
  Administered 2018-08-17 – 2018-08-18 (×2): 4 mg via INTRAVENOUS
  Filled 2018-08-17 (×2): qty 2

## 2018-08-17 MED ORDER — OXYCODONE HCL 5 MG PO TABS
5.0000 mg | ORAL_TABLET | Freq: Four times a day (QID) | ORAL | Status: AC | PRN
  Administered 2018-08-17 – 2018-08-18 (×2): 5 mg via ORAL
  Filled 2018-08-17 (×2): qty 1

## 2018-08-17 MED ORDER — MORPHINE SULFATE (PF) 2 MG/ML IV SOLN
1.0000 mg | Freq: Once | INTRAVENOUS | Status: AC
  Administered 2018-08-17: 1 mg via INTRAVENOUS
  Filled 2018-08-17: qty 1

## 2018-08-17 MED ORDER — OPIUM 10 MG/ML (1%) PO TINC
6.0000 mg | Freq: Four times a day (QID) | ORAL | Status: DC | PRN
  Administered 2018-08-17 – 2018-08-19 (×7): 6 mg via ORAL
  Filled 2018-08-17 (×7): qty 1

## 2018-08-17 NOTE — Progress Notes (Signed)
Unable to draw blood from port this AM and now reports "burning senation" under the port hub. No signs of infiltration or needle dislodgement. Flushes without difficulty, but still has no blood return. Patient reports that the port has rarely given blood and has had a "burning sensation" near the hub since insertion on 08-03-18. Recommend radiology studies to determine patency and establish the reason for poor blood return so frequently on a new port.

## 2018-08-17 NOTE — Progress Notes (Signed)
At 0846, Wyline Copas, MD was paged regarding the pt's c/o of burning and sharp pain at the chest port site. Pt is requesting pain medication as well.

## 2018-08-17 NOTE — Progress Notes (Addendum)
At Jose Persia, MD  was paged regarding the pt's c/o lower abd pain. Pt reports feeling as if she has been punched in the stomach. Pt is requesting PRN pain med for relief.   Wyline Copas, MD put in orders for a one time dose of morphine 1 mg.

## 2018-08-17 NOTE — Consult Note (Signed)
[place] [facility] ROUNDING NOTE   Subjective: No acute issues overnight.  She states that she feels otherwise in her usual state of health.  Migraine this morning requiring medications which she states is essentially a daily occurrence for her.  Still with high ostomy output emptying ostomy bag multiple times overnight.  Improved appetite and p.o. intake this morning for breakfast.  Overall feels sexually the same.  Does report discomfort at her right port site.  Additionally states that she has had reduced ROM of right shoulder after port placement.   Objective: Vital signs in last 24 hours: Temp:  [98.6 F (37 C)-98.8 F (37.1 C)] 98.8 F (37.1 C) (12/31 2209) Pulse Rate:  [68-81] 68 (01/01 0608) Resp:  [17-18] 17 (01/01 0608) BP: (104-120)/(64-86) 120/83 (01/01 6734) SpO2:  [98 %-100 %] 100 % (01/01 0608) Last BM Date: 08/17/18 General: NAD Abdomen: Ostomy in place in LLQ; stoma appears healthy, pink. Soft, NT, ND Ext: No c/c/e   Intake/Output from previous day: 12/31 0701 - 01/01 0700 In: 1571.6 [P.O.:960; I.V.:611.6] Out: 3350 [Urine:1450; Stool:1900] Intake/Output this shift: Total I/O In: 799.9 [P.O.:240; I.V.:559.9] Out: 600 [Urine:150; Stool:450]   Lab Results: Recent Labs    08/15/18 1133 08/16/18 0829 08/17/18 0725  WBC 9.4 8.2 14.3*  HGB 11.9* 12.2 11.9*  PLT 381 360 375  MCV 94.1 93.2 94.8   BMET Recent Labs    08/15/18 1133 08/16/18 0829 08/17/18 0725  NA 139 141 143  K 3.7 3.7 4.4  CL 110 113* 111  CO2 22 22 23   GLUCOSE 87 93 110*  BUN 10 6 <5*  CREATININE 1.18* 1.07* 1.21*  CALCIUM 8.7* 8.9 9.2   LFT Recent Labs    08/15/18 1133  PROT 6.4*  ALBUMIN 3.3*  AST 14*  ALT 10  ALKPHOS 91  BILITOT 0.1*   PT/INR Recent Labs    08/17/18 0725  INR 0.89      Imaging/Other results: No results found.    Assessment   43 year old female with a complex history of Crohn's disease requiring multiple surgical interventions in  the past and has either failed/not responded to multiple biologic agents, presenting with high ostomy output.  Reviewed her extensive documentation where she has been evaluated by Dr. Domenica Fail at [place] [facility] as well as Dr. Dorrene German at [facility] and most recently by Dr. Lyndel Safe at [place].  She has a history of high ostomy output requiring admissions in the past for volume replacement and electrolyte abnormalities.  Has trialed multiple agents to include Imodium, Lomotil, Questran with limited to no response.  Most recently, she has a referral in to see Dr. Renee Harder at [facility] for consideration of Gattex.  She has had multiple intra-abdominal surgeries starting at age 2 with ileocecectomy and last surgery was a diverting colostomy due to rectal prolapse in 2017.  Looking at available documentation of her extensive surgical history it is difficult to ascertain how much small bowel remains.  Of note had an EGD and colonoscopy by Dr. Dorrene German in 02/2018 which reported normal EGD, normal colon with normal neo-TI and negative biopsies.  No evidence of active Crohn's on that or the previous EGD/colonoscopy in 2018 at [facility].  We extensively discussed her high ostomy output and possible short gut syndrome.  She has not responded to medications to date.  Has had a port placed with consideration of TPN.  Unfortunately has had difficulty accessing support and with discomfort at the port (see below).  Regarding medication  options, we did discuss trial of tincture of opium along with alternatives including clonidine.  I do not have any experience with Gattex and for this she has a referral already pending as above.  After much discussion, she would like to try a course of tincture of opium if we are able to obtain this as an inpatient.  - Start tincture of opium - Continue p.o. intake as tolerated - IV fluids with electrolyte repletion - Continue I/O - C. difficile negative.  Remainder of infectious work-up  pending.  2) Port site irritation: Patient reports irritation at the port.  Difficulty accessing port historically.  Recommend discussion with general surgery or IR regarding port interrogation.  3) Leukocytosis: WBC 14.3 today from 8.2 yesterday.  Aside from the above pending work-up for GI infectious etiology, does not seem to be exhibiting outward sign of active infection.  Continue to monitor for now.  4) AKI: Mild bump in creatinine to 1.2 from 1.07 yesterday. -Resume IV fluids for likely prerenal AKI from high ostomy output  Ultimately, will likely need referral back to a quaternary academic center such as [facility] or [facility] for ongoing care of this very complex Crohn's disease.  No current need for urgent inpatient transfer though.    [place], DO  08/17/2018, 1:10 PM [place] [facility] Pager [phone]

## 2018-08-17 NOTE — Progress Notes (Signed)
PROGRESS NOTE    Stacey Simpson  MBT:[id number] DOB: 03/10/1976 DOA: 08/15/2018 PCP: [name], NP    Brief Narrative:  43 y.o. female with history of chronic migraine headaches, chronic Lyme's disease,  CKDII, Crohn's status post chronic colostomy, short gut syndrome require fluids transfusion 2-3 times per week sent from GI clinic for admission due to  Increased ostomy output, dehydration.  She has no fever, denies of abdominal pain.  No nausea no vomiting .  ED course: Initial on presentation she has slight sinus tachycardia, blood pressure stable, no fever.  Basic lab work at baseline.  She is given hydration, 1 dose of Reglan for chronic headache, hospitalist called to further manage the patient.  Assessment & Plan:   Active Problems:   Crohn's disease ([place])   Colostomy care ([place])   Short gut syndrome   Dehydration  H/o Crohn's disease/short gut syndrome -Just under 2L output noted overnight -Will require continued IVF hydration given large stool output -Repeat bmet in AM -Appreciate input by GI. Recommendations for trial of tincture of opium -Prior trials of Gattex noted.  -Recommendation for eventual follow up at tertiary care center  Leukocytosis -Afebrile at this time -Repeat cbc in AM -Cdiff and stool culture neg  Dehydration -Continue on IVF hydration  DVT prophylaxis: Lovenox subQ Code Status: Full Family Communication: Pt in room, family at bedside Disposition Plan: Uncertain at this time  Consultants:   GI  Procedures:     Antimicrobials: Anti-infectives (From admission, onward)   None       Subjective: Still with large volume stool output  Objective: Vitals:   08/16/18 1358 08/16/18 2209 08/17/18 0608 08/17/18 1345  BP: 116/86 104/64 120/83 128/86  Pulse: 73 81 68 72  Resp: 18 18 17 16   Temp: 98.6 F (37 C) 98.8 F (37.1 C)  98.6 F (37 C)  TempSrc: Oral Oral  Oral  SpO2: 99% 98% 100% 99%  Weight:      Height:         Intake/Output Summary (Last 24 hours) at 08/17/2018 1622 Last data filed at 08/17/2018 1400 Gross per 24 hour  Intake 2116.51 ml  Output 3425 ml  Net -1308.49 ml   Filed Weights   08/15/18 1516  Weight: 65 kg    Examination:  General exam: Appears calm and comfortable  Respiratory system: Clear to auscultation. Respiratory effort normal. Cardiovascular system: S1 & S2 heard, RRR Gastrointestinal system: Ostomy bag in place, hyperactive BS, nondistended Central nervous system: Alert and oriented. No focal neurological deficits. Extremities: Symmetric 5 x 5 power. Skin: No rashes, lesions  Psychiatry: Judgement and insight appear normal. Mood & affect appropriate.   Data Reviewed: I have personally reviewed following labs and imaging studies  CBC: Recent Labs  Lab 08/15/18 1133 08/16/18 0829 08/17/18 0725  WBC 9.4 8.2 14.3*  NEUTROABS 5.6 3.7 9.9*  HGB 11.9* 12.2 11.9*  HCT 38.1 38.5 38.1  MCV 94.1 93.2 94.8  PLT 381 360 536   Basic Metabolic Panel: Recent Labs  Lab 08/15/18 1133 08/16/18 0829 08/17/18 0725  NA 139 141 143  K 3.7 3.7 4.4  CL 110 113* 111  CO2 22 22 23   GLUCOSE 87 93 110*  BUN 10 6 <5*  CREATININE 1.18* 1.07* 1.21*  CALCIUM 8.7* 8.9 9.2  MG 2.1 2.0 2.0   GFR: Estimated Creatinine Clearance: 55.5 mL/min (A) (by C-G formula based on SCr of 1.21 mg/dL (H)). Liver Function Tests: Recent Labs  Lab 08/15/18 1133  AST 14*  ALT 10  ALKPHOS 91  BILITOT 0.1*  PROT 6.4*  ALBUMIN 3.3*   No results for input(s): LIPASE, AMYLASE in the last 168 hours. No results for input(s): AMMONIA in the last 168 hours. Coagulation Profile: Recent Labs  Lab 08/17/18 0725  INR 0.89   Cardiac Enzymes: No results for input(s): CKTOTAL, CKMB, CKMBINDEX, TROPONINI in the last 168 hours. BNP (last 3 results) No results for input(s): PROBNP in the last 8760 hours. HbA1C: No results for input(s): HGBA1C in the last 72 hours. CBG: No results for input(s):  GLUCAP in the last 168 hours. Lipid Profile: No results for input(s): CHOL, HDL, LDLCALC, TRIG, CHOLHDL, LDLDIRECT in the last 72 hours. Thyroid Function Tests: Recent Labs    08/16/18 1322  TSH 1.168   Anemia Panel: No results for input(s): VITAMINB12, FOLATE, FERRITIN, TIBC, IRON, RETICCTPCT in the last 72 hours. Sepsis Labs: No results for input(s): PROCALCITON, LATICACIDVEN in the last 168 hours.  Recent Results (from the past 240 hour(s))  Gastrointestinal Panel by PCR , Stool     Status: None   Collection Time: 08/16/18 11:09 AM  Result Value Ref Range Status   Campylobacter species NOT DETECTED NOT DETECTED Final   Plesimonas shigelloides NOT DETECTED NOT DETECTED Final   Salmonella species NOT DETECTED NOT DETECTED Final   Yersinia enterocolitica NOT DETECTED NOT DETECTED Final   Vibrio species NOT DETECTED NOT DETECTED Final   Vibrio cholerae NOT DETECTED NOT DETECTED Final   Enteroaggregative E coli (EAEC) NOT DETECTED NOT DETECTED Final   Enteropathogenic E coli (EPEC) NOT DETECTED NOT DETECTED Final   Enterotoxigenic E coli (ETEC) NOT DETECTED NOT DETECTED Final   Shiga like toxin producing E coli (STEC) NOT DETECTED NOT DETECTED Final   Shigella/Enteroinvasive E coli (EIEC) NOT DETECTED NOT DETECTED Final   Cryptosporidium NOT DETECTED NOT DETECTED Final   Cyclospora cayetanensis NOT DETECTED NOT DETECTED Final   Entamoeba histolytica NOT DETECTED NOT DETECTED Final   Giardia lamblia NOT DETECTED NOT DETECTED Final   Adenovirus F40/41 NOT DETECTED NOT DETECTED Final   Astrovirus NOT DETECTED NOT DETECTED Final   Norovirus GI/GII NOT DETECTED NOT DETECTED Final   Rotavirus A NOT DETECTED NOT DETECTED Final   Sapovirus (I, II, IV, and V) NOT DETECTED NOT DETECTED Final    Comment: Performed at [facility], [address] [place], [place] [address]  C difficile quick scan w PCR reflex     Status: None   Collection Time: 08/16/18 11:10 AM  Result Value  Ref Range Status   C Diff antigen NEGATIVE NEGATIVE Final   C Diff toxin NEGATIVE NEGATIVE Final   C Diff interpretation No C. difficile detected.  Final    Comment: Performed at [facility], [place] [address] [place], [place] [address]     Radiology Studies: No results found.  Scheduled Meds: . enoxaparin (LOVENOX) injection  40 mg Subcutaneous Q24H  . fluticasone  1 spray Each Nare Daily  . sertraline  100 mg Oral Daily  . sodium chloride flush  10-40 mL Intracatheter Q12H  . traZODone  100 mg Oral QHS  . vortioxetine HBr  20 mg Oral Daily   Continuous Infusions: . dextrose 5 % and 0.45 % NaCl with KCl 20 mEq/L 75 mL/hr at 08/17/18 0515     LOS: 0 days   Marylu Lund, MD [facility] Pager On Amion  If 7PM-7AM, please contact night-coverage 08/17/2018, 4:22 PM

## 2018-08-18 ENCOUNTER — Inpatient Hospital Stay (HOSPITAL_COMMUNITY): Admission: RE | Admit: 2018-08-18 | Payer: Managed Care, Other (non HMO) | Source: Ambulatory Visit

## 2018-08-18 DIAGNOSIS — Z433 Encounter for attention to colostomy: Secondary | ICD-10-CM | POA: Diagnosis not present

## 2018-08-18 DIAGNOSIS — K912 Postsurgical malabsorption, not elsewhere classified: Secondary | ICD-10-CM | POA: Diagnosis not present

## 2018-08-18 DIAGNOSIS — K50918 Crohn's disease, unspecified, with other complication: Secondary | ICD-10-CM | POA: Diagnosis not present

## 2018-08-18 LAB — CBC
HEMATOCRIT: 37.7 % (ref 36.0–46.0)
HEMOGLOBIN: 11.7 g/dL — AB (ref 12.0–15.0)
MCH: 28.6 pg (ref 26.0–34.0)
MCHC: 31 g/dL (ref 30.0–36.0)
MCV: 92.2 fL (ref 80.0–100.0)
Platelets: 147 10*3/uL — ABNORMAL LOW (ref 150–400)
RBC: 4.09 MIL/uL (ref 3.87–5.11)
RDW: 13.2 % (ref 11.5–15.5)
WBC: 9 10*3/uL (ref 4.0–10.5)
nRBC: 0 % (ref 0.0–0.2)

## 2018-08-18 LAB — CALCIUM, IONIZED: Calcium, Ionized, Serum: 5.1 mg/dL (ref 4.5–5.6)

## 2018-08-18 LAB — BASIC METABOLIC PANEL
Anion gap: 8 (ref 5–15)
BUN: <5 mg/dL — ABNORMAL LOW (ref 6–20)
CO2: 23 mmol/L (ref 22–32)
Calcium: 8.8 mg/dL — ABNORMAL LOW (ref 8.9–10.3)
Chloride: 110 mmol/L (ref 98–111)
Creatinine, Ser: 1.2 mg/dL — ABNORMAL HIGH (ref 0.44–1.00)
GFR calc Af Amer: >60 mL/min (ref >60)
GFR calc non Af Amer: 55 mL/min — ABNORMAL LOW (ref >60)
Glucose, Bld: 85 mg/dL (ref 70–99)
POTASSIUM: 4.2 mmol/L (ref 3.5–5.1)
Sodium: 141 mmol/L (ref 135–145)

## 2018-08-18 LAB — GASTRIN: Gastrin: 22 pg/mL (ref 0–115)

## 2018-08-18 MED ORDER — ALTEPLASE 2 MG IJ SOLR
2.0000 mg | Freq: Once | INTRAMUSCULAR | Status: AC
  Administered 2018-08-18: 2 mg
  Filled 2018-08-18: qty 2

## 2018-08-18 MED ORDER — KCL IN DEXTROSE-NACL 20-5-0.45 MEQ/L-%-% IV SOLN
INTRAVENOUS | Status: DC
  Administered 2018-08-18: 21:00:00 via INTRAVENOUS
  Filled 2018-08-18 (×2): qty 1000

## 2018-08-18 MED ORDER — ONDANSETRON HCL 4 MG/2ML IJ SOLN
4.0000 mg | Freq: Four times a day (QID) | INTRAMUSCULAR | Status: DC | PRN
  Administered 2018-08-19: 4 mg via INTRAVENOUS
  Filled 2018-08-18: qty 2

## 2018-08-18 NOTE — Telephone Encounter (Signed)
-----   Message from Irving Copas., MD sent at 08/16/2018  3:00 PM EST ----- Regarding: Needs [facility] Consultation Dear Merrie Roof and Lesly Rubenstein, When the clinic returns on Thursday, can we see about trying to call Dr. Seward Meth office at [facility] to try and see if an earlier appointment may be possible. Patty, I put you on here, incase someone else is covering for Lesly Rubenstein to direct this message to. Thanks. Chester Holstein

## 2018-08-18 NOTE — Telephone Encounter (Signed)
Called and spoke with office rep at Dr. Seward Meth office-they are to call Dr. Steve Rattler office back with appt date/time as patient did call Dr. Seward Meth office on 07/20/2018 and the message was not routed to appropriate personnel to schedule patient's appt; Dr. Seward Meth office to also notify patient of date/time of appt when it is scheduled; Dr. Lyndel Safe will also be updated when appt date/time is known;

## 2018-08-18 NOTE — Progress Notes (Signed)
PROGRESS NOTE    Stacey Simpson  OXB:[id number] DOB: [birth year] DOA: 08/15/2018 PCP: [name], NP    Brief Narrative:  43 y.o. female with history of chronic migraine headaches, chronic Lyme's disease,  CKDII, Crohn's status post chronic colostomy, short gut syndrome require fluids transfusion 2-3 times per week sent from GI clinic for admission due to  Increased ostomy output, dehydration.  She has no fever, denies of abdominal pain.  No nausea no vomiting .  ED course: Initial on presentation she has slight sinus tachycardia, blood pressure stable, no fever.  Basic lab work at baseline.  She is given hydration, 1 dose of Reglan for chronic headache, hospitalist called to further manage the patient.  Assessment & Plan:   Active Problems:   Crohn's disease ([place])   Colostomy care ([place])   Short gut syndrome   Dehydration  H/o Crohn's disease/short gut syndrome -Pt reports over 3L ostomy output noted overnight -Will require continued IVF hydration given large stool output -Repeat bmet in AM -Appreciate input by GI. Pt has noted some improvement with tincture of opium -Prior trials of Gattex noted.  -Recommendation for eventual follow up at tertiary care center  Leukocytosis -Afebrile at this time -Repeat cbc in AM -Cdiff and stool culture neg -Stable  Dehydration -Continue on IVF hydration  DVT prophylaxis: Lovenox subQ Code Status: Full Family Communication: Pt in room, family at bedside Disposition Plan: Uncertain at this time  Consultants:   GI  Procedures:     Antimicrobials: Anti-infectives (From admission, onward)   None      Subjective: States stool seems to be bulking up. Still with significant stool output  Objective: Vitals:   08/17/18 2015 08/18/18 0622 08/18/18 0640 08/18/18 1357  BP: 130/80 (!) 81/53 97/69 128/88  Pulse: 70 65 72 74  Resp: 12 14 15 16   Temp: 98.3 F (36.8 C) 98.3 F (36.8 C)  98.1 F (36.7 C)  TempSrc: Oral  Oral  Oral  SpO2: 99% 98%  100%  Weight:      Height:        Intake/Output Summary (Last 24 hours) at 08/18/2018 1706 Last data filed at 08/18/2018 1544 Gross per 24 hour  Intake 2793.99 ml  Output 3800 ml  Net -1006.01 ml   Filed Weights   08/15/18 1516  Weight: 65 kg    Examination: General exam: Awake, laying in bed, in nad Respiratory system: Normal respiratory effort, no wheezing Cardiovascular system: regular rate, s1, s2 Gastrointestinal system: Ostomy in place, pos BS Central nervous system: CN2-12 grossly intact, strength intact Extremities: Perfused, no clubbing Skin: Normal skin turgor, no notable skin lesions seen Psychiatry: Mood normal // no visual hallucinations   Data Reviewed: I have personally reviewed following labs and imaging studies  CBC: Recent Labs  Lab 08/15/18 1133 08/16/18 0829 08/17/18 0725  WBC 9.4 8.2 14.3*  NEUTROABS 5.6 3.7 9.9*  HGB 11.9* 12.2 11.9*  HCT 38.1 38.5 38.1  MCV 94.1 93.2 94.8  PLT 381 360 683   Basic Metabolic Panel: Recent Labs  Lab 08/15/18 1133 08/16/18 0829 08/17/18 0725 08/18/18 0444  NA 139 141 143 141  K 3.7 3.7 4.4 4.2  CL 110 113* 111 110  CO2 22 22 23 23   GLUCOSE 87 93 110* 85  BUN 10 6 <5* <5*  CREATININE 1.18* 1.07* 1.21* 1.20*  CALCIUM 8.7* 8.9 9.2 8.8*  MG 2.1 2.0 2.0  --    GFR: Estimated Creatinine Clearance: 56 mL/min (A) (by C-G  formula based on SCr of 1.2 mg/dL (H)). Liver Function Tests: Recent Labs  Lab 08/15/18 1133  AST 14*  ALT 10  ALKPHOS 91  BILITOT 0.1*  PROT 6.4*  ALBUMIN 3.3*   No results for input(s): LIPASE, AMYLASE in the last 168 hours. No results for input(s): AMMONIA in the last 168 hours. Coagulation Profile: Recent Labs  Lab 08/17/18 0725  INR 0.89   Cardiac Enzymes: No results for input(s): CKTOTAL, CKMB, CKMBINDEX, TROPONINI in the last 168 hours. BNP (last 3 results) No results for input(s): PROBNP in the last 8760 hours. HbA1C: No results for  input(s): HGBA1C in the last 72 hours. CBG: No results for input(s): GLUCAP in the last 168 hours. Lipid Profile: No results for input(s): CHOL, HDL, LDLCALC, TRIG, CHOLHDL, LDLDIRECT in the last 72 hours. Thyroid Function Tests: Recent Labs    08/16/18 1322  TSH 1.168   Anemia Panel: No results for input(s): VITAMINB12, FOLATE, FERRITIN, TIBC, IRON, RETICCTPCT in the last 72 hours. Sepsis Labs: No results for input(s): PROCALCITON, LATICACIDVEN in the last 168 hours.  Recent Results (from the past 240 hour(s))  Gastrointestinal Panel by PCR , Stool     Status: None   Collection Time: 08/16/18 11:09 AM  Result Value Ref Range Status   Campylobacter species NOT DETECTED NOT DETECTED Final   Plesimonas shigelloides NOT DETECTED NOT DETECTED Final   Salmonella species NOT DETECTED NOT DETECTED Final   Yersinia enterocolitica NOT DETECTED NOT DETECTED Final   Vibrio species NOT DETECTED NOT DETECTED Final   Vibrio cholerae NOT DETECTED NOT DETECTED Final   Enteroaggregative E coli (EAEC) NOT DETECTED NOT DETECTED Final   Enteropathogenic E coli (EPEC) NOT DETECTED NOT DETECTED Final   Enterotoxigenic E coli (ETEC) NOT DETECTED NOT DETECTED Final   Shiga like toxin producing E coli (STEC) NOT DETECTED NOT DETECTED Final   Shigella/Enteroinvasive E coli (EIEC) NOT DETECTED NOT DETECTED Final   Cryptosporidium NOT DETECTED NOT DETECTED Final   Cyclospora cayetanensis NOT DETECTED NOT DETECTED Final   Entamoeba histolytica NOT DETECTED NOT DETECTED Final   Giardia lamblia NOT DETECTED NOT DETECTED Final   Adenovirus F40/41 NOT DETECTED NOT DETECTED Final   Astrovirus NOT DETECTED NOT DETECTED Final   Norovirus GI/GII NOT DETECTED NOT DETECTED Final   Rotavirus A NOT DETECTED NOT DETECTED Final   Sapovirus (I, II, IV, and V) NOT DETECTED NOT DETECTED Final    Comment: Performed at [facility], [address] [place], [place] [address]  C difficile quick scan w PCR reflex      Status: None   Collection Time: 08/16/18 11:10 AM  Result Value Ref Range Status   C Diff antigen NEGATIVE NEGATIVE Final   C Diff toxin NEGATIVE NEGATIVE Final   C Diff interpretation No C. difficile detected.  Final    Comment: Performed at [facility], [place] [address] [place], [place] [address]     Radiology Studies: No results found.  Scheduled Meds: . enoxaparin (LOVENOX) injection  40 mg Subcutaneous Q24H  . fluticasone  1 spray Each Nare Daily  . sertraline  100 mg Oral Daily  . sodium chloride flush  10-40 mL Intracatheter Q12H  . traZODone  100 mg Oral QHS  . vortioxetine HBr  20 mg Oral Daily   Continuous Infusions:    LOS: 0 days   Marylu Lund, MD [facility] Pager On Amion  If 7PM-7AM, please contact night-coverage 08/18/2018, 5:06 PM

## 2018-08-18 NOTE — Progress Notes (Signed)
    Progress Note   Subjective  Chief Complaint: Significant ostomy output, history of Crohn's disease status post colostomy  Patient reports she actually had a decrease in output since having 3 opium tincture doses, only 3.1 L yesterday, does report increased bowel gas as well as a "punched in the gut" feeling, but this is not abnormal for her.  She has experienced it at home before.  Did wake up with a terrible migraine which is not abnormal for her either.  Abated after 2 Imitrex and some nausea medicine.  Tells me she is ready to go home if there is a plan for her to follow-up with Dr. Koleen Distance at [facility].    Objective   Vital signs in last 24 hours: Temp:  [98.3 F (36.8 C)-98.6 F (37 C)] 98.3 F (36.8 C) (01/02 0622) Pulse Rate:  [65-72] 72 (01/02 0640) Resp:  [12-16] 15 (01/02 0640) BP: (81-130)/(53-86) 97/69 (01/02 0640) SpO2:  [98 %-99 %] 98 % (01/02 0622) Last BM Date: 08/17/18 General:    white female in NAD Heart:  Regular rate and rhythm; no murmurs Lungs: Respirations even and unlabored, lungs CTA bilaterally Abdomen:  Soft, mild periumbilical ttp and nondistended. Normal bowel sounds. Extremities:  Without edema. Neurologic:  Alert and oriented,  grossly normal neurologically. Psych:  Cooperative. Normal mood and affect.  Intake/Output from previous day: 01/01 0701 - 01/02 0700 In: 3259.8 [P.O.:1200; I.V.:2059.8] Out: 8938 [Urine:2300; BOFBP:1025] Intake/Output this shift: Total I/O In: -  Out: 700 [Urine:500; Stool:200]  Lab Results: Recent Labs    08/16/18 0829 08/17/18 0725  WBC 8.2 14.3*  HGB 12.2 11.9*  HCT 38.5 38.1  PLT 360 375   BMET Recent Labs    08/16/18 0829 08/17/18 0725 08/18/18 0444  NA 141 143 141  K 3.7 4.4 4.2  CL 113* 111 110  CO2 22 23 23   GLUCOSE 93 110* 85  BUN 6 <5* <5*  CREATININE 1.07* 1.21* 1.20*  CALCIUM 8.9 9.2 8.8*   PT/INR Recent Labs    08/17/18 0725  LABPROT 12.0  INR 0.89    Assessment / Plan:    Assessment: 1.  Excessive ostomy output: See extensive notes previously, has improved some with tincture of opium status post 3 doses now, infectious work-up negative including GI pathogen panel and C. difficile 2.  Port site irritation 3.  Leukocytosis: No CBC today, yesterday 14.3 4.  AKI: Creatinine 1.2 today  Plan: 1.  Patient will likely be discharged home today with plans for follow-up with [facility] as an outpatient. 2.  Would recommend continuing opium tincture 3.  Ordered repeat CBC today to check on leukocytosis 4.  Please await any further recommendations from Dr. Bryan Lemma later today.  Likely will be signing off.  Thank you for your kind consultation.   LOS: 0 days   Levin Erp  08/18/2018, 11:51 AM

## 2018-08-19 ENCOUNTER — Encounter (HOSPITAL_COMMUNITY): Payer: Managed Care, Other (non HMO)

## 2018-08-19 ENCOUNTER — Telehealth: Payer: Self-pay | Admitting: *Deleted

## 2018-08-19 DIAGNOSIS — Z932 Ileostomy status: Secondary | ICD-10-CM | POA: Diagnosis not present

## 2018-08-19 DIAGNOSIS — K912 Postsurgical malabsorption, not elsewhere classified: Secondary | ICD-10-CM | POA: Diagnosis not present

## 2018-08-19 DIAGNOSIS — E860 Dehydration: Secondary | ICD-10-CM | POA: Diagnosis not present

## 2018-08-19 DIAGNOSIS — R198 Other specified symptoms and signs involving the digestive system and abdomen: Secondary | ICD-10-CM | POA: Diagnosis not present

## 2018-08-19 DIAGNOSIS — K50818 Crohn's disease of both small and large intestine with other complication: Secondary | ICD-10-CM | POA: Diagnosis not present

## 2018-08-19 LAB — PANCREATIC ELASTASE, FECAL

## 2018-08-19 LAB — BASIC METABOLIC PANEL
Anion gap: 7 (ref 5–15)
BUN: 5 mg/dL — AB (ref 6–20)
CO2: 24 mmol/L (ref 22–32)
CREATININE: 1.23 mg/dL — AB (ref 0.44–1.00)
Calcium: 8.2 mg/dL — ABNORMAL LOW (ref 8.9–10.3)
Chloride: 106 mmol/L (ref 98–111)
GFR calc Af Amer: >60 mL/min (ref >60)
GFR calc non Af Amer: 53 mL/min — ABNORMAL LOW (ref >60)
Glucose, Bld: 89 mg/dL (ref 70–99)
Potassium: 4.1 mmol/L (ref 3.5–5.1)
SODIUM: 137 mmol/L (ref 135–145)

## 2018-08-19 MED ORDER — HEPARIN SOD (PORK) LOCK FLUSH 100 UNIT/ML IV SOLN
500.0000 [IU] | INTRAVENOUS | Status: AC | PRN
  Administered 2018-08-19: 500 [IU]

## 2018-08-19 MED ORDER — OPIUM 10 MG/ML (1%) PO TINC: 6.0000 mg | Freq: Four times a day (QID) | ORAL | 0 refills | Status: DC | PRN

## 2018-08-19 NOTE — Telephone Encounter (Signed)
Please advise 

## 2018-08-19 NOTE — Progress Notes (Addendum)
    Progress Note   Subjective  Chief Complaint: Significant ostomy output, history of Crohn's disease status post colostomy  This morning patient reports that she had another great decrease in output over the past 24 hours with opium tincture.  Also reports that her bowel gas has improved overnight and her "punched in the gut feeling" is only 3/10 today which is "nothing to complain about".  Patient also woke up without a headache this morning which is improvement.  Overall the patient is improved and would like to go home.    She does have questions in regards to her port placed by surgery and some discomfort.  Would like to see someone in regards to this before leaving.   Objective   Vital signs in last 24 hours: Temp:  [97.8 F (36.6 C)-98.5 F (36.9 C)] 98.5 F (36.9 C) (01/03 0601) Pulse Rate:  [66-74] 68 (01/03 0601) Resp:  [16-17] 17 (01/03 0601) BP: (92-128)/(61-88) 92/61 (01/03 0601) SpO2:  [97 %-100 %] 97 % (01/03 0601) Last BM Date: 08/19/18 General:    white female in NAD Heart:  Regular rate and rhythm; no murmurs Lungs: Respirations even and unlabored, lungs CTA bilaterally Abdomen:  Soft, mild generalized ttp and nondistended. Normal bowel sounds. + colostomy Extremities:  Without edema. Neurologic:  Alert and oriented,  grossly normal neurologically. Psych:  Cooperative. Normal mood and affect.  Intake/Output from previous day: 01/02 0701 - 01/03 0700 In: 1996.8 [P.O.:680; I.V.:1316.8] Out: 3675 [Urine:2675; Stool:1000]  Lab Results: Recent Labs    08/17/18 0725 08/18/18 1633  WBC 14.3* 9.0  HGB 11.9* 11.7*  HCT 38.1 37.7  PLT 375 147*   BMET Recent Labs    08/17/18 0725 08/18/18 0444 08/19/18 0445  NA 143 141 137  K 4.4 4.2 4.1  CL 111 110 106  CO2 23 23 24   GLUCOSE 110* 85 89  BUN <5* <5* 5*  CREATININE 1.21* 1.20* 1.23*  CALCIUM 9.2 8.8* 8.2*   PT/INR Recent Labs    08/17/18 0725  LABPROT 12.0  INR 0.89    Assessment / Plan:     Assessment: 1.  Excessive ostomy output: Per Dr. Rush Landmark no true documentation to indicate short gut syndrome, output has greatly decreased since starting opium tincture 2.  Port site irritation 3.  Leukocytosis 4.  AKI  Plan: 1.  Patient can be discharged home today from a GI standpoint.  We will arrange follow-up with her for [place] team is already working on this. 2.  Recommend continuing opium tincture 3.  Discussed patient's case with surgical PA who will round on the patient in regards to port site discomfort today. 4.  We will arrange for patient to have follow-up with Dr. Lyndel Safe in 4 to 6 weeks, hopefully she will have seen [facility] by then.  Thank you for your kind consultation.    LOS: 0 days   Levin Erp  08/19/2018, 10:12 AM  Since she has outpatient IVF treatment may be able to go home today. Will be available if needed and see again if she does not make it home by Monday but think she should.  The tincture of opium has helped her greatly - should be able to get through [place] (I think) would check on that today as that will be key to help her have less output.  Gatha Mayer, MD, [facility] 08/19/2018 11:12 AM Pager [phone]

## 2018-08-19 NOTE — Discharge Summary (Signed)
Physician Discharge Summary  Stacey Simpson TDD:[id number] DOB: [birth year] DOA: 08/15/2018  PCP: [name], NP  Admit date: 08/15/2018 Discharge date: 08/19/2018  Admitted From: Home Disposition:  Home  Recommendations for Outpatient Follow-up:  1. Follow up with PCP in 1-2 weeks 2. Follow up with GI as scheduled  Discharge Condition:Stable CODE STATUS:Full Diet recommendation: Regular   Brief/Interim Summary: 43 y.o.femalewith history of chronic migraine headaches,chronic Lyme's disease, CKDII, Crohn's status post chronic colostomy,short gut syndrome require fluids transfusion 2-3 times per week sent from GI clinic for admission due to Increased ostomy output,dehydration.She has no fever,denies of abdominal pain.No nausea no vomiting .  ED course: Initial on presentation she has slight sinus tachycardia,blood pressure stable,no fever.Basic lab work at baseline.She is given hydration,1 dose of Reglan for chronic headache,hospitalist called to further manage the patient.  Discharge Diagnoses:  Active Problems:   Crohn's disease ([place])   Colostomy care ([place])   Short gut syndrome   Dehydration   High output ileostomy ([place])  H/o Crohn's disease/short gut syndrome -Renal function remained stable, continued on IVF hydration given continued stool output -Appreciate input by GI. Pt has noted improvement with tincture of opium, prescribed -Prior trials of Gattex noted.  -Recommendation for eventual follow up at tertiary care center as outpt -Grandfather reviewed. No recent opioids prescribed -OK for d/c today per GI  Leukocytosis -Afebrile at this time -Repeat cbc in AM -Cdiff and stool culture neg -Stable  Dehydration -Improved with IVF hydration   Discharge Instructions   Allergies as of 08/19/2018      Reactions   Doxycycline Nausea And Vomiting      Medication List    TAKE these medications   AMBULATORY NON FORMULARY MEDICATION Inject 1,000  mLs into the vein 3 (three) times a week. Medication Name: IV NS with 40 meq Potassium infuse over 3 hours; give 3 times per week for 3 months; port flushes per pharmacy protocol; may flush port on date of order and draw lab work-CBC,CMP,Magnesium;   clonazePAM 0.5 MG tablet Commonly known as:  KLONOPIN Take 0.5 mg by mouth 2 (two) times daily as needed for anxiety.   diphenhydrAMINE 25 MG tablet Commonly known as:  BENADRYL Take 25 mg by mouth daily as needed for allergies.   Opium 10 MG/ML (1%) Tinc Take 0.6 mLs (6 mg total) by mouth every 6 (six) hours as needed for diarrhea or loose stools.   Potassium Chloride ER 20 MEQ Tbcr Take 20 mEq by mouth daily.   promethazine 25 MG tablet Commonly known as:  PHENERGAN Take 1 tablet (25 mg total) by mouth every 8 (eight) hours as needed for nausea or vomiting.   QNASL 80 MCG/ACT Aers Generic drug:  Beclomethasone Dipropionate Place 1 spray into the nose as needed (congestion).   sertraline 100 MG tablet Commonly known as:  ZOLOFT Take 100 mg by mouth daily at 12 noon.   SUMAtriptan 25 MG tablet Commonly known as:  IMITREX Take 25 mg by mouth as needed for migraine.   traZODone 100 MG tablet Commonly known as:  DESYREL Take 100 mg by mouth at bedtime.   TRINTELLIX 20 MG Tabs tablet Generic drug:  vortioxetine HBr Take 20 mg by mouth daily at 12 noon.      Follow-up Information    [name], NP. Schedule an appointment as soon as possible for a visit in 2 week(s).   Specialty:  Nurse Practitioner Contact information: [address] [place] [address] [phone]  Allergies  Allergen Reactions  . Doxycycline Nausea And Vomiting    Consultations:  GI  Procedures/Studies: Ir Cv Line Injection  Result Date: 08/03/2018 INDICATION: Difficult access of right jugular Port-A-Cath EXAM: RIGHT JUGULAR PORT-A-CATH INJECTION MEDICATIONS: None ANESTHESIA/SEDATION: None FLUOROSCOPY TIME:   Fluoroscopy Time:  minutes 6 seconds (4 mGy). COMPLICATIONS: None immediate. PROCEDURE: Informed written consent was obtained from the patient after a thorough discussion of the procedural risks, benefits and alternatives. All questions were addressed. Maximal Sterile Barrier Technique was utilized including caps, mask, sterile gowns, sterile gloves, sterile drape, hand hygiene and skin antiseptic. A timeout was performed prior to the initiation of the procedure. The right upper chest port reservoir site was prepped and draped in a sterile fashion. Utilizing sterile technique, a Huber needle was advanced into the reservoir of the port. Aspiration resulted in a flash of blood. Contrast was injected and imaging was obtained. The port reservoir was then heparinized. The needle was removed. FINDINGS: Contrast fills the reservoir of the port and exits the tip of the catheter which has at the cavoatrial junction. There is a very minimal fibrin sheath at the tip. There is no evidence of obstruction or extravasation to suggest leak of contrast. IMPRESSION: The right jugular Port-A-Cath is appropriately positioned and is patent without evidence of leak. Electronically Signed   By: Marybelle Killings M.D.   On: 08/03/2018 15:28     Subjective: Eager to go home  Discharge Exam: Vitals:   08/19/18 0601 08/19/18 1340  BP: 92/61 118/67  Pulse: 68 70  Resp: 17 16  Temp: 98.5 F (36.9 C) 98.5 F (36.9 C)  SpO2: 97% 100%   Vitals:   08/18/18 1357 08/18/18 2013 08/19/18 0601 08/19/18 1340  BP: 128/88 109/72 92/61 118/67  Pulse: 74 66 68 70  Resp: 16 16 17 16   Temp: 98.1 F (36.7 C) 97.8 F (36.6 C) 98.5 F (36.9 C) 98.5 F (36.9 C)  TempSrc: Oral Axillary Oral Oral  SpO2: 100% 99% 97% 100%  Weight:      Height:        General: Pt is alert, awake, not in acute distress Cardiovascular: RRR, S1/S2 +, no rubs, no gallops Respiratory: CTA bilaterally, no wheezing, no rhonchi Abdominal: Soft, NT, ND, bowel  sounds + Extremities: no edema, no cyanosis   The results of significant diagnostics from this hospitalization (including imaging, microbiology, ancillary and laboratory) are listed below for reference.     Microbiology: Recent Results (from the past 240 hour(s))  Gastrointestinal Panel by PCR , Stool     Status: None   Collection Time: 08/16/18 11:09 AM  Result Value Ref Range Status   Campylobacter species NOT DETECTED NOT DETECTED Final   Plesimonas shigelloides NOT DETECTED NOT DETECTED Final   Salmonella species NOT DETECTED NOT DETECTED Final   Yersinia enterocolitica NOT DETECTED NOT DETECTED Final   Vibrio species NOT DETECTED NOT DETECTED Final   Vibrio cholerae NOT DETECTED NOT DETECTED Final   Enteroaggregative E coli (EAEC) NOT DETECTED NOT DETECTED Final   Enteropathogenic E coli (EPEC) NOT DETECTED NOT DETECTED Final   Enterotoxigenic E coli (ETEC) NOT DETECTED NOT DETECTED Final   Shiga like toxin producing E coli (STEC) NOT DETECTED NOT DETECTED Final   Shigella/Enteroinvasive E coli (EIEC) NOT DETECTED NOT DETECTED Final   Cryptosporidium NOT DETECTED NOT DETECTED Final   Cyclospora cayetanensis NOT DETECTED NOT DETECTED Final   Entamoeba histolytica NOT DETECTED NOT DETECTED Final   Giardia lamblia NOT DETECTED NOT  DETECTED Final   Adenovirus F40/41 NOT DETECTED NOT DETECTED Final   Astrovirus NOT DETECTED NOT DETECTED Final   Norovirus GI/GII NOT DETECTED NOT DETECTED Final   Rotavirus A NOT DETECTED NOT DETECTED Final   Sapovirus (I, II, IV, and V) NOT DETECTED NOT DETECTED Final    Comment: Performed at [facility], [address] [place], [place] [address]  C difficile quick scan w PCR reflex     Status: None   Collection Time: 08/16/18 11:10 AM  Result Value Ref Range Status   C Diff antigen NEGATIVE NEGATIVE Final   C Diff toxin NEGATIVE NEGATIVE Final   C Diff interpretation No C. difficile detected.  Final    Comment: Performed at [facility], [place] [address] [place], [place] [address]     Labs: BNP (last 3 results) No results for input(s): BNP in the last 8760 hours. Basic Metabolic Panel: Recent Labs  Lab 08/15/18 1133 08/16/18 0829 08/17/18 0725 08/18/18 0444 08/19/18 0445  NA 139 141 143 141 137  K 3.7 3.7 4.4 4.2 4.1  CL 110 113* 111 110 106  CO2 22 22 23 23 24   GLUCOSE 87 93 110* 85 89  BUN 10 6 <5* <5* 5*  CREATININE 1.18* 1.07* 1.21* 1.20* 1.23*  CALCIUM 8.7* 8.9 9.2 8.8* 8.2*  MG 2.1 2.0 2.0  --   --    Liver Function Tests: Recent Labs  Lab 08/15/18 1133  AST 14*  ALT 10  ALKPHOS 91  BILITOT 0.1*  PROT 6.4*  ALBUMIN 3.3*   No results for input(s): LIPASE, AMYLASE in the last 168 hours. No results for input(s): AMMONIA in the last 168 hours. CBC: Recent Labs  Lab 08/15/18 1133 08/16/18 0829 08/17/18 0725 08/18/18 1633  WBC 9.4 8.2 14.3* 9.0  NEUTROABS 5.6 3.7 9.9*  --   HGB 11.9* 12.2 11.9* 11.7*  HCT 38.1 38.5 38.1 37.7  MCV 94.1 93.2 94.8 92.2  PLT 381 360 375 147*   Cardiac Enzymes: No results for input(s): CKTOTAL, CKMB, CKMBINDEX, TROPONINI in the last 168 hours. BNP: Invalid input(s): POCBNP CBG: No results for input(s): GLUCAP in the last 168 hours. D-Dimer No results for input(s): DDIMER in the last 72 hours. Hgb A1c No results for input(s): HGBA1C in the last 72 hours. Lipid Profile No results for input(s): CHOL, HDL, LDLCALC, TRIG, CHOLHDL, LDLDIRECT in the last 72 hours. Thyroid function studies No results for input(s): TSH, T4TOTAL, T3FREE, THYROIDAB in the last 72 hours.  Invalid input(s): FREET3 Anemia work up No results for input(s): VITAMINB12, FOLATE, FERRITIN, TIBC, IRON, RETICCTPCT in the last 72 hours. Urinalysis    Component Value Date/Time   COLORURINE YELLOW 08/12/2018 1911   APPEARANCEUR CLEAR 08/12/2018 1911   LABSPEC 1.010 08/12/2018 1911   PHURINE 5.0 08/12/2018 1911   GLUCOSEU NEGATIVE 08/12/2018 1911   HGBUR NEGATIVE  08/12/2018 1911   BILIRUBINUR NEGATIVE 08/12/2018 1911   KETONESUR NEGATIVE 08/12/2018 1911   PROTEINUR NEGATIVE 08/12/2018 1911   NITRITE NEGATIVE 08/12/2018 1911   LEUKOCYTESUR NEGATIVE 08/12/2018 1911   Sepsis Labs Invalid input(s): PROCALCITONIN,  WBC,  LACTICIDVEN Microbiology Recent Results (from the past 240 hour(s))  Gastrointestinal Panel by PCR , Stool     Status: None   Collection Time: 08/16/18 11:09 AM  Result Value Ref Range Status   Campylobacter species NOT DETECTED NOT DETECTED Final   Plesimonas shigelloides NOT DETECTED NOT DETECTED Final   Salmonella species NOT DETECTED NOT DETECTED Final  Yersinia enterocolitica NOT DETECTED NOT DETECTED Final   Vibrio species NOT DETECTED NOT DETECTED Final   Vibrio cholerae NOT DETECTED NOT DETECTED Final   Enteroaggregative E coli (EAEC) NOT DETECTED NOT DETECTED Final   Enteropathogenic E coli (EPEC) NOT DETECTED NOT DETECTED Final   Enterotoxigenic E coli (ETEC) NOT DETECTED NOT DETECTED Final   Shiga like toxin producing E coli (STEC) NOT DETECTED NOT DETECTED Final   Shigella/Enteroinvasive E coli (EIEC) NOT DETECTED NOT DETECTED Final   Cryptosporidium NOT DETECTED NOT DETECTED Final   Cyclospora cayetanensis NOT DETECTED NOT DETECTED Final   Entamoeba histolytica NOT DETECTED NOT DETECTED Final   Giardia lamblia NOT DETECTED NOT DETECTED Final   Adenovirus F40/41 NOT DETECTED NOT DETECTED Final   Astrovirus NOT DETECTED NOT DETECTED Final   Norovirus GI/GII NOT DETECTED NOT DETECTED Final   Rotavirus A NOT DETECTED NOT DETECTED Final   Sapovirus (I, II, IV, and V) NOT DETECTED NOT DETECTED Final    Comment: Performed at [facility], [address] [place], [place] [address]  C difficile quick scan w PCR reflex     Status: None   Collection Time: 08/16/18 11:10 AM  Result Value Ref Range Status   C Diff antigen NEGATIVE NEGATIVE Final   C Diff toxin NEGATIVE NEGATIVE Final   C Diff interpretation No  C. difficile detected.  Final    Comment: Performed at [facility], [place] [address], [place], [place] [address]   Time spent: 30 min  SIGNED:   Marylu Lund, MD  [facility] 08/19/2018, 2:06 PM  If 7PM-7AM, please contact night-coverage

## 2018-08-19 NOTE — Plan of Care (Signed)
Patient to discharge home. Awaiting IV team to deaccess [facility]

## 2018-08-19 NOTE — Telephone Encounter (Signed)
Pt has lab appt scheduled with [place] for 08/23/18. Future orders were placed on 07/26/18. I see that pt has been in the hospital since these were ordered and it looks like she had all of these done while in the hospital.  Please advised if pt still needs lab appt on Monday?

## 2018-08-22 ENCOUNTER — Encounter (HOSPITAL_COMMUNITY): Payer: Managed Care, Other (non HMO)

## 2018-08-23 ENCOUNTER — Other Ambulatory Visit: Payer: Managed Care, Other (non HMO)

## 2018-08-23 NOTE — Telephone Encounter (Signed)
Hold off on the labs for now

## 2018-08-24 ENCOUNTER — Ambulatory Visit (HOSPITAL_COMMUNITY)
Admission: RE | Admit: 2018-08-24 | Discharge: 2018-08-24 | Disposition: A | Discharge status: Home / Self Care | Payer: Managed Care, Other (non HMO) | Source: Ambulatory Visit | Attending: Gastroenterology | Admitting: Gastroenterology

## 2018-08-24 DIAGNOSIS — K912 Postsurgical malabsorption, not elsewhere classified: Secondary | ICD-10-CM | POA: Insufficient documentation

## 2018-08-24 DIAGNOSIS — R197 Diarrhea, unspecified: Secondary | ICD-10-CM | POA: Insufficient documentation

## 2018-08-24 MED ORDER — SODIUM CHLORIDE 0.9% FLUSH
10.0000 mL | INTRAVENOUS | Status: AC | PRN
  Administered 2018-08-24: 10 mL

## 2018-08-24 MED ORDER — HEPARIN SOD (PORK) LOCK FLUSH 100 UNIT/ML IV SOLN
250.0000 [IU] | INTRAVENOUS | Status: AC | PRN
  Administered 2018-08-24: 250 [IU]
  Filled 2018-08-24: qty 5

## 2018-08-24 MED ORDER — LACTATED RINGERS IV SOLN
INTRAVENOUS | Status: AC
  Administered 2018-08-24: 11:00:00 via INTRAVENOUS

## 2018-08-24 NOTE — Discharge Instructions (Signed)
Hydration

## 2018-08-24 NOTE — Progress Notes (Signed)
PATIENT CARE CENTER NOTE  Diagnosis:Short gut syndrome/malabsorption   Provider:Dr. Jackquline Denmark   Procedure:1 liter bolus of Lactated Ringers   Note:Patient received 1 liter bolus of LR through right chest PAC. Tolerated infusion well with no adverse reaction. Vital signs stable. Discharge instructions given. Patient alert, oriented and ambulatory at discharge.

## 2018-08-26 ENCOUNTER — Other Ambulatory Visit (INDEPENDENT_AMBULATORY_CARE_PROVIDER_SITE_OTHER): Payer: Managed Care, Other (non HMO)

## 2018-08-26 ENCOUNTER — Ambulatory Visit (HOSPITAL_COMMUNITY)
Admission: RE | Admit: 2018-08-26 | Discharge: 2018-08-26 | Disposition: A | Discharge status: Home / Self Care | Payer: Managed Care, Other (non HMO) | Source: Ambulatory Visit | Attending: Gastroenterology | Admitting: Gastroenterology

## 2018-08-26 ENCOUNTER — Ambulatory Visit (INDEPENDENT_AMBULATORY_CARE_PROVIDER_SITE_OTHER): Payer: Managed Care, Other (non HMO) | Admitting: Gastroenterology

## 2018-08-26 ENCOUNTER — Telehealth: Payer: Self-pay | Admitting: Gastroenterology

## 2018-08-26 ENCOUNTER — Encounter: Payer: Self-pay | Admitting: Gastroenterology

## 2018-08-26 VITALS — BP 110/62 | HR 64 | Ht 66.0 in | Wt 141.0 lb

## 2018-08-26 DIAGNOSIS — K50919 Crohn's disease, unspecified, with unspecified complications: Secondary | ICD-10-CM

## 2018-08-26 DIAGNOSIS — K912 Postsurgical malabsorption, not elsewhere classified: Secondary | ICD-10-CM | POA: Diagnosis not present

## 2018-08-26 MED ORDER — SODIUM CHLORIDE 0.9% FLUSH
10.0000 mL | INTRAVENOUS | Status: AC | PRN
  Administered 2018-08-26: 10 mL

## 2018-08-26 MED ORDER — LACTATED RINGERS IV BOLUS
1000.0000 mL | Freq: Once | INTRAVENOUS | Status: AC
  Administered 2018-08-26: 1000 mL via INTRAVENOUS

## 2018-08-26 MED ORDER — HEPARIN SOD (PORK) LOCK FLUSH 100 UNIT/ML IV SOLN
500.0000 [IU] | INTRAVENOUS | Status: AC | PRN
  Administered 2018-08-26: 500 [IU]
  Filled 2018-08-26: qty 5

## 2018-08-26 NOTE — Telephone Encounter (Signed)
Stacey Simpson from [place] at [facility] requested a CB.

## 2018-08-26 NOTE — Discharge Instructions (Signed)
Dehydration, Adult  Dehydration is when there is not enough fluid or water in your body. This happens when you lose more fluids than you take in. Dehydration can range from mild to very bad. It should be treated right away to keep it from getting very bad. Symptoms of mild dehydration may include:  Thirst.  Dry lips.  Slightly dry mouth.  Dry, warm skin.  Dizziness. Symptoms of moderate dehydration may include:  Very dry mouth.  Muscle cramps.  Dark pee (urine). Pee may be the color of tea.  Your body making less pee.  Your eyes making fewer tears.  Heartbeat that is uneven or faster than normal (palpitations).  Headache.  Light-headedness, especially when you stand up from sitting.  Fainting (syncope). Symptoms of very bad dehydration may include:  Changes in skin, such as: ? Cold and clammy skin. ? Blotchy (mottled) or pale skin. ? Skin that does not quickly return to normal after being lightly pinched and let go (poor skin turgor).  Changes in body fluids, such as: ? Feeling very thirsty. ? Your eyes making fewer tears. ? Not sweating when body temperature is high, such as in hot weather. ? Your body making very little pee.  Changes in vital signs, such as: ? Weak pulse. ? Pulse that is more than 100 beats a minute when you are sitting still. ? Fast breathing. ? Low blood pressure.  Other changes, such as: ? Sunken eyes. ? Cold hands and feet. ? Confusion. ? Lack of energy (lethargy). ? Trouble waking up from sleep. ? Short-term weight loss. ? Unconsciousness. Follow these instructions at home:   If told by your doctor, drink an ORS: ? Make an ORS by using instructions on the package. ? Start by drinking small amounts, about  cup (120 mL) every 5-10 minutes. ? Slowly drink more until you have had the amount that your doctor said to have.  Drink enough clear fluid to keep your pee clear or pale yellow. If you were told to drink an ORS, finish the  ORS first, then start slowly drinking clear fluids. Drink fluids such as: ? Water. Do not drink only water by itself. Doing that can make the salt (sodium) level in your body get too low (hyponatremia). ? Ice chips. ? Fruit juice that you have added water to (diluted). ? Low-calorie sports drinks.  Avoid: ? Alcohol. ? Drinks that have a lot of sugar. These include high-calorie sports drinks, fruit juice that does not have water added, and soda. ? Caffeine. ? Foods that are greasy or have a lot of fat or sugar.  Take over-the-counter and prescription medicines only as told by your doctor.  Do not take salt tablets. Doing that can make the salt level in your body get too high (hypernatremia).  Eat foods that have minerals (electrolytes). Examples include bananas, oranges, potatoes, tomatoes, and spinach.  Keep all follow-up visits as told by your doctor. This is important. Contact a doctor if:  You have belly (abdominal) pain that: ? Gets worse. ? Stays in one area (localizes).  You have a rash.  You have a stiff neck.  You get angry or annoyed more easily than normal (irritability).  You are more sleepy than normal.  You have a harder time waking up than normal.  You feel: ? Weak. ? Dizzy. ? Very thirsty.  You have peed (urinated) only a small amount of very dark pee during 6-8 hours. Get help right away if:  You have  symptoms of very bad dehydration.  You cannot drink fluids without throwing up (vomiting).  Your symptoms get worse with treatment.  You have a fever.  You have a very bad headache.  You are throwing up or having watery poop (diarrhea) and it: ? Gets worse. ? Does not go away.  You have blood or something green (bile) in your throw-up.  You have blood in your poop (stool). This may cause poop to look black and tarry.  You have not peed in 6-8 hours.  You pass out (faint).  Your heart rate when you are sitting still is more than 100 beats a  minute.  You have trouble breathing. This information is not intended to replace advice given to you by your health care provider. Make sure you discuss any questions you have with your health care provider. Document Released: 05/30/2009 Document Revised: 02/21/2016 Document Reviewed: 09/27/2015 Elsevier Interactive Patient Education  2019 [employer].

## 2018-08-26 NOTE — Patient Instructions (Signed)
If you are age 45 or older, your body mass index should be between 23-30. Your Body mass index is 22.76 kg/m. If this is out of the aforementioned range listed, please consider follow up with your Primary Care Provider.  If you are age 64 or younger, your body mass index should be between 19-25. Your Body mass index is 22.76 kg/m. If this is out of the aformentioned range listed, please consider follow up with your Primary Care Provider.   Please go to the lab on the 2nd floor [address] before you leave the office today.   Someone from [facility] will contact you with more information about this medication.   Thank you,  Dr. Jackquline Denmark

## 2018-08-26 NOTE — Telephone Encounter (Signed)
Patrice from [facility] informed RN that patient is currently scheduled for 09/15/2018 appt at 11:30 am -unless Dr. Lyndel Safe feels this patient must be seen sooner due to life threatening condition the patient will be seen as scheduled;

## 2018-08-26 NOTE — Progress Notes (Signed)
Chief Complaint: FU   Referring Provider:  [name], NP      ASSESSMENT AND PLAN;   #1.  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 S/P port placement -currently undergoing IVF replacement every 2 weeks.  Failed multiple agents including Imodium, Lomotil, cholestyramine.  Did well with tincture of opium.  It is difficult to ascertain how much small bowel she has. #2.  Crohn's disease: Dx at age 43,([place], [place]), s/p R hemicolectomy with TI resection at age 89, s/p L colon resection due to abscess, then colostomy d/t rectal prolapse 2017. failed Humira, Cimzia, Remicade and most recently Entyvio.  Has been to multiple gastroenterologists at multiple centers.  Multiple admissions.  Most recently at [facility] transferred to [facility]. S/P EGD and colon (Dr Dorrene German) 02/21/2018 ([place] EGD, neg colon to neo-TI, neg Bx). CT 02/2018: skip lesions but minimal inflammation. #3.  Cyclical nausea/vomiting. Neg EGD, CT neg for SBO. #4.  Comorbid conditions including anxiety/depression.  Plan: - Tincture of opium (39m/ml) 0.656mq8hrs. # 1 bottle. - Continue Kdur 2028mpo qd. - Quit smoking. - [facility] Has appt in Jan 2019. - IV RL 1 lit thru port every 2 weekly.  Will adjust electrolytes depending upon the blood tests. - Check CBC, CMP, Mg in 1 week - If still will problems, will consider 48-72 hr stool collection and investigations. - Patient has gone over risks and benefits of Gattex in detail.  She is willing to give it a try.  [facility].  I have signed the requisition. - Please obtain previous records. Follow VIP and Serotonin [facility] FU 12 weeks. No indication for steroids at the present time since last EGD and colonoscopy did not show any significant inflammation.  HPI:    SheDASHONDA BONNEAU a 42 61o. female  Very complicated unfortunate patient Has been to multiple gastroenterologists at multiple  centers. with history of Crohn's disease- Dx at age 43,([place], [place]), status post right hemicolectomy with TI resection at the age [age] [facility] to [facility], Cimzia, Remicade and most recently Entyvio.  Has been to multiple gastroenterologists at multiple centers.  Multiple admissions.  [facility] (Dr RhoDorrene German/03/2018 ([place] EGD, colon normal neo-TI and colon, neg Bx). CT 02/2018: skip lesions but minimal inflammation.  With continued diarrhea -empties her ileostomy bag 10-15 times a day -watery bowel movements without any blood.   No further nausea/vomiting.  her symptoms were attributed to possible short bowel syndrome and cyclical nausea/vomiting with electrolyte abnormalities.  Status post port placement.  Hospitalized early January for 3 days due to high ostomy output.  Given IV fluids, started on tincture of opium with good results.  She is currently having problems getting tincture of opium as an outpatient.  It costs too much and only few pharmacies carry it.  Her lab work has been stable-chronically low potassium, normal magnesium, she always had elevated WBC count, platelet count.  Hemoglobin has been normal.  She has gained weight as below:  [employer]01/10/20 1406  Weight: 141 lb (64 kg)      Past Medical History:  Diagnosis Date  . Anxiety   . [facility] ([facility] . Herpes   . Hypokalemia   . Insomnia   . Lyme disease   .  Major depressive disorder   . Migraines   . Short bowel syndrome   . Tachycardia   . Vitamin B12 deficiency     Past Surgical History:  Procedure Laterality Date  . ABDOMINAL HYSTERECTOMY    . APPENDECTOMY  1990s  . COLOSTOMY  2017   Dr Carmell Austria, [place], [place]  . IR CV LINE INJECTION  08/03/2018  . PORTACATH PLACEMENT Left 06/15/2018   Procedure: INSERTION PORT-A-CATH UNDER ULTRASOUND AND  FLUROSCOPIC GUIDANCE;  Surgeon: Michael Boston, MD;  Location: [facility];  Service: General;  Laterality: Left;  . RIGHT COLECTOMY  2015   Dr Carrington Clamp.  Resection of ileocolonic anastomotic stricture.     Family History  Adopted: Yes  Family history unknown: Yes    Social History   Tobacco Use  . Smoking status: Current Every Day Smoker    Packs/day: 0.25    Types: Cigarettes  . Smokeless tobacco: Never Used  Substance Use Topics  . Alcohol use: No  . Drug use: No    Current Outpatient Medications  Medication Sig Dispense Refill  . AMBULATORY NON FORMULARY MEDICATION Inject 1,000 mLs into the vein 3 (three) times a week. Medication Name: IV NS with 40 meq Potassium infuse over 3 hours; give 3 times per week for 3 months; port flushes per pharmacy protocol; may flush port on date of order and draw lab work-CBC,CMP,Magnesium; 36 Dose 0  . clonazePAM (KLONOPIN) 0.5 MG tablet Take 0.5 mg by mouth 2 (two) times daily as needed for anxiety.     . diphenhydrAMINE (BENADRYL) 25 MG tablet Take 25 mg by mouth daily as needed for allergies.    . Opium 10 MG/ML (1%) TINC Take 0.6 mLs (6 mg total) by mouth every 6 (six) hours as needed for diarrhea or loose stools. 1 Bottle 0  . promethazine (PHENERGAN) 25 MG tablet Take 1 tablet (25 mg total) by mouth every 8 (eight) hours as needed for nausea or vomiting. 30 tablet 2  . QNASL 80 MCG/ACT AERS Place 1 spray into the nose as needed (congestion).   0  . sertraline (ZOLOFT) 100 MG tablet Take 100 mg by mouth daily at 12 noon.   2  . SUMAtriptan (IMITREX) 25 MG tablet Take 25 mg by mouth as needed for migraine.     . traZODone (DESYREL) 100 MG tablet Take 100 mg by mouth at bedtime.     . vortioxetine HBr (TRINTELLIX) 20 MG TABS Take 20 mg by mouth daily at 12 noon.      No current facility-administered medications for this visit.     Allergies  Allergen Reactions  . Doxycycline Nausea And Vomiting    Review of Systems:  Constitutional:  Denies fever, chills, diaphoresis, appetite change and fatigue.  HEENT: Denies photophobia, eye pain, redness, hearing loss, ear pain, congestion, sore throat, rhinorrhea, sneezing, mouth sores, neck pain, neck stiffness and tinnitus.   Respiratory: Denies SOB, DOE, cough, chest tightness,  and wheezing.   Cardiovascular: Denies chest pain, palpitations and leg swelling.  Genitourinary: Denies dysuria, urgency, frequency, hematuria, flank pain and difficulty urinating.  Musculoskeletal: Denies myalgias, back pain, joint swelling, arthralgias and gait problem.  Skin: No rash.  Neurological: Has dizziness. No seizures, syncope, weakness, light-headedness, numbness and headaches.  Hematological: Denies adenopathy. Easy bruising, personal or family bleeding history  Psychiatric/Behavioral: has anxiety or depression     Physical Exam:    BP 110/62   Pulse 64  Ht 5' 6"  (1.676 m)   Wt 141 lb (64 kg)   BMI 22.76 kg/m  Filed Weights   08/26/18 1406  Weight: 141 lb (64 kg)   Constitutional:  Well-developed, in no acute distress. Psychiatric: Normal mood and affect. Behavior is normal. HEENT: Pupils normal.  Conjunctivae are normal. No scleral icterus. Neck supple.  Cardiovascular: Normal rate, regular rhythm. No edema.  Port in the right supraclavicular area Pulmonary/chest: Effort normal and breath sounds normal. No wheezing, rales or rhonchi. Abdominal: Soft, nondistended. Nontender. Bowel sounds active throughout. There are no masses palpable. No hepatomegaly. Has ostomy Rectal:  defered Neurological: Alert and oriented to person place and time. Skin: Skin is warm and dry. No rashes noted. 25 minutes spent with the patient today. Greater than 50% was spent in counseling and coordination of care with the patient   Carmell Austria, MD 08/26/2018, 2:27 PM  Cc: [name], NP

## 2018-08-26 NOTE — Progress Notes (Signed)
PATIENT CARE CENTER NOTE  Diagnosis:Short gut syndrome/malabsorption   Provider:Dr. Jackquline Denmark   Procedure:1 liter bolus of Lactated Ringers   Note:Patient received 1 liter bolus of LR through right chest PAC. Tolerated infusion well with no adverse reaction. Vital signs stable. Discharge instructions given. Patient alert, oriented and ambulatory at discharge.

## 2018-08-27 LAB — COMPREHENSIVE METABOLIC PANEL
AG Ratio: 1.4 (calc) (ref 1.0–2.5)
ALT: 8 U/L (ref 6–29)
AST: 12 U/L (ref 10–30)
Albumin: 3.9 g/dL (ref 3.6–5.1)
Alkaline phosphatase (APISO): 105 U/L (ref 33–115)
BUN / CREAT RATIO: 4 (calc) — AB (ref 6–22)
BUN: 5 mg/dL — ABNORMAL LOW (ref 7–25)
CO2: 25 mmol/L (ref 20–32)
Calcium: 9.2 mg/dL (ref 8.6–10.2)
Chloride: 106 mmol/L (ref 98–110)
Creat: 1.18 mg/dL — ABNORMAL HIGH (ref 0.50–1.10)
GLUCOSE: 85 mg/dL (ref 65–99)
Globulin: 2.8 g/dL (calc) (ref 1.9–3.7)
Potassium: 4.7 mmol/L (ref 3.5–5.3)
SODIUM: 138 mmol/L (ref 135–146)
TOTAL PROTEIN: 6.7 g/dL (ref 6.1–8.1)
Total Bilirubin: 0.3 mg/dL (ref 0.2–1.2)

## 2018-08-27 LAB — CBC WITH DIFFERENTIAL/PLATELET
Absolute Monocytes: 485 cells/uL (ref 200–950)
Basophils Absolute: 29 cells/uL (ref 0–200)
Basophils Relative: 0.3 %
EOS PCT: 1.8 %
Eosinophils Absolute: 171 cells/uL (ref 15–500)
HEMATOCRIT: 38.3 % (ref 35.0–45.0)
Hemoglobin: 12.5 g/dL (ref 11.7–15.5)
Lymphs Abs: 3819 cells/uL (ref 850–3900)
MCH: 29.1 pg (ref 27.0–33.0)
MCHC: 32.6 g/dL (ref 32.0–36.0)
MCV: 89.1 fL (ref 80.0–100.0)
MPV: 8.7 fL (ref 7.5–12.5)
Monocytes Relative: 5.1 %
Neutro Abs: 4997 cells/uL (ref 1500–7800)
Neutrophils Relative %: 52.6 %
Platelets: 520 10*3/uL — ABNORMAL HIGH (ref 140–400)
RBC: 4.3 10*6/uL (ref 3.80–5.10)
RDW: 13.1 % (ref 11.0–15.0)
Total Lymphocyte: 40.2 %
WBC: 9.5 10*3/uL (ref 3.8–10.8)

## 2018-08-27 LAB — MAGNESIUM: Magnesium: 1.9 mg/dL (ref 1.5–2.5)

## 2018-08-29 ENCOUNTER — Encounter (HOSPITAL_COMMUNITY): Payer: Managed Care, Other (non HMO)

## 2018-08-30 ENCOUNTER — Other Ambulatory Visit: Payer: Self-pay

## 2018-08-30 ENCOUNTER — Telehealth: Payer: Self-pay | Admitting: Gastroenterology

## 2018-08-30 ENCOUNTER — Ambulatory Visit (HOSPITAL_COMMUNITY)
Admission: RE | Admit: 2018-08-30 | Discharge: 2018-08-30 | Disposition: A | Discharge status: Home / Self Care | Payer: Managed Care, Other (non HMO) | Source: Ambulatory Visit | Attending: Gastroenterology | Admitting: Gastroenterology

## 2018-08-30 ENCOUNTER — Encounter (HOSPITAL_COMMUNITY): Payer: Self-pay

## 2018-08-30 DIAGNOSIS — K912 Postsurgical malabsorption, not elsewhere classified: Secondary | ICD-10-CM

## 2018-08-30 DIAGNOSIS — K50919 Crohn's disease, unspecified, with unspecified complications: Secondary | ICD-10-CM

## 2018-08-30 DIAGNOSIS — R197 Diarrhea, unspecified: Secondary | ICD-10-CM

## 2018-08-30 DIAGNOSIS — K90829 Short bowel syndrome, unspecified: Secondary | ICD-10-CM

## 2018-08-30 DIAGNOSIS — E46 Unspecified protein-calorie malnutrition: Secondary | ICD-10-CM

## 2018-08-30 MED ORDER — LACTATED RINGERS IV BOLUS: 1000.0000 mL | Freq: Once | INTRAVENOUS | Status: DC

## 2018-08-30 NOTE — Progress Notes (Signed)
These labs have been ordered in advance for the patient to have drawn 4 weeks after today for continuity of care per Dr. Steve Rattler recommendations;

## 2018-08-31 ENCOUNTER — Encounter (HOSPITAL_COMMUNITY): Payer: Managed Care, Other (non HMO)

## 2018-09-02 ENCOUNTER — Telehealth: Payer: Self-pay | Admitting: Gastroenterology

## 2018-09-02 ENCOUNTER — Ambulatory Visit (HOSPITAL_COMMUNITY)
Admission: RE | Admit: 2018-09-02 | Discharge: 2018-09-02 | Disposition: A | Discharge status: Home / Self Care | Payer: Managed Care, Other (non HMO) | Source: Ambulatory Visit | Attending: Gastroenterology | Admitting: Gastroenterology

## 2018-09-02 DIAGNOSIS — K912 Postsurgical malabsorption, not elsewhere classified: Secondary | ICD-10-CM | POA: Diagnosis not present

## 2018-09-02 MED ORDER — HEPARIN SOD (PORK) LOCK FLUSH 100 UNIT/ML IV SOLN
500.0000 [IU] | INTRAVENOUS | Status: AC | PRN
  Administered 2018-09-02: 500 [IU]
  Filled 2018-09-02: qty 5

## 2018-09-02 MED ORDER — SODIUM CHLORIDE 0.9% FLUSH
10.0000 mL | INTRAVENOUS | Status: AC | PRN
  Administered 2018-09-02: 10 mL

## 2018-09-02 MED ORDER — LACTATED RINGERS IV BOLUS
1000.0000 mL | Freq: Once | INTRAVENOUS | Status: AC
  Administered 2018-09-02: 1000 mL via INTRAVENOUS

## 2018-09-02 NOTE — Progress Notes (Signed)
Patient received 1 liter bolus of Lactated Ringers through right chest implanted port. Port accessed and deaccessed per protocol. Tolerated infusion well with no adverse reaction. Vital signs stable. Discharge instructions given. Patient alert, oriented and ambulatory at discharge.

## 2018-09-02 NOTE — Telephone Encounter (Signed)
Mendel Ryder from [place] called for a PA for [employer].  CB: [phone]

## 2018-09-02 NOTE — Discharge Instructions (Signed)
Dehydration, Adult  Dehydration is when there is not enough fluid or water in your body. This happens when you lose more fluids than you take in. Dehydration can range from mild to very bad. It should be treated right away to keep it from getting very bad. Symptoms of mild dehydration may include:  Thirst.  Dry lips.  Slightly dry mouth.  Dry, warm skin.  Dizziness. Symptoms of moderate dehydration may include:  Very dry mouth.  Muscle cramps.  Dark pee (urine). Pee may be the color of tea.  Your body making less pee.  Your eyes making fewer tears.  Heartbeat that is uneven or faster than normal (palpitations).  Headache.  Light-headedness, especially when you stand up from sitting.  Fainting (syncope). Symptoms of very bad dehydration may include:  Changes in skin, such as: ? Cold and clammy skin. ? Blotchy (mottled) or pale skin. ? Skin that does not quickly return to normal after being lightly pinched and let go (poor skin turgor).  Changes in body fluids, such as: ? Feeling very thirsty. ? Your eyes making fewer tears. ? Not sweating when body temperature is high, such as in hot weather. ? Your body making very little pee.  Changes in vital signs, such as: ? Weak pulse. ? Pulse that is more than 100 beats a minute when you are sitting still. ? Fast breathing. ? Low blood pressure.  Other changes, such as: ? Sunken eyes. ? Cold hands and feet. ? Confusion. ? Lack of energy (lethargy). ? Trouble waking up from sleep. ? Short-term weight loss. ? Unconsciousness. Follow these instructions at home:   If told by your doctor, drink an ORS: ? Make an ORS by using instructions on the package. ? Start by drinking small amounts, about  cup (120 mL) every 5-10 minutes. ? Slowly drink more until you have had the amount that your doctor said to have.  Drink enough clear fluid to keep your pee clear or pale yellow. If you were told to drink an ORS, finish the  ORS first, then start slowly drinking clear fluids. Drink fluids such as: ? Water. Do not drink only water by itself. Doing that can make the salt (sodium) level in your body get too low (hyponatremia). ? Ice chips. ? Fruit juice that you have added water to (diluted). ? Low-calorie sports drinks.  Avoid: ? Alcohol. ? Drinks that have a lot of sugar. These include high-calorie sports drinks, fruit juice that does not have water added, and soda. ? Caffeine. ? Foods that are greasy or have a lot of fat or sugar.  Take over-the-counter and prescription medicines only as told by your doctor.  Do not take salt tablets. Doing that can make the salt level in your body get too high (hypernatremia).  Eat foods that have minerals (electrolytes). Examples include bananas, oranges, potatoes, tomatoes, and spinach.  Keep all follow-up visits as told by your doctor. This is important. Contact a doctor if:  You have belly (abdominal) pain that: ? Gets worse. ? Stays in one area (localizes).  You have a rash.  You have a stiff neck.  You get angry or annoyed more easily than normal (irritability).  You are more sleepy than normal.  You have a harder time waking up than normal.  You feel: ? Weak. ? Dizzy. ? Very thirsty.  You have peed (urinated) only a small amount of very dark pee during 6-8 hours. Get help right away if:  You have  symptoms of very bad dehydration.  You cannot drink fluids without throwing up (vomiting).  Your symptoms get worse with treatment.  You have a fever.  You have a very bad headache.  You are throwing up or having watery poop (diarrhea) and it: ? Gets worse. ? Does not go away.  You have blood or something green (bile) in your throw-up.  You have blood in your poop (stool). This may cause poop to look black and tarry.  You have not peed in 6-8 hours.  You pass out (faint).  Your heart rate when you are sitting still is more than 100 beats a  minute.  You have trouble breathing. This information is not intended to replace advice given to you by your health care provider. Make sure you discuss any questions you have with your health care provider. Document Released: 05/30/2009 Document Revised: 02/21/2016 Document Reviewed: 09/27/2015 Elsevier Interactive Patient Education  2019 [employer].

## 2018-09-05 ENCOUNTER — Encounter (HOSPITAL_COMMUNITY): Payer: Managed Care, Other (non HMO)

## 2018-09-05 NOTE — Telephone Encounter (Signed)
Spoke with Gattext rep-paperwork faxed into insurance company for prior authorization request;  Awaiting response from insurance company;-will notify Gattex rep when notification received;

## 2018-09-05 NOTE — Telephone Encounter (Signed)
Attempted to reach rep-unable to leave a message at this time; will attempt to reach rep at a later date/time;

## 2018-09-07 ENCOUNTER — Encounter (HOSPITAL_COMMUNITY): Payer: Managed Care, Other (non HMO)

## 2018-09-08 NOTE — Telephone Encounter (Signed)
Called and spoke with [facility] pharmacy rep-PA is still in process;  Will obtain update at a later date/time;

## 2018-09-09 ENCOUNTER — Encounter (HOSPITAL_COMMUNITY): Payer: Managed Care, Other (non HMO)

## 2018-09-12 ENCOUNTER — Encounter (HOSPITAL_COMMUNITY): Payer: Managed Care, Other (non HMO)

## 2018-09-14 ENCOUNTER — Encounter (HOSPITAL_COMMUNITY): Payer: Managed Care, Other (non HMO)

## 2018-09-14 ENCOUNTER — Ambulatory Visit (HOSPITAL_COMMUNITY)
Admission: RE | Admit: 2018-09-14 | Discharge: 2018-09-14 | Disposition: A | Discharge status: Home / Self Care | Payer: Managed Care, Other (non HMO) | Source: Ambulatory Visit | Attending: Gastroenterology | Admitting: Gastroenterology

## 2018-09-14 DIAGNOSIS — K912 Postsurgical malabsorption, not elsewhere classified: Secondary | ICD-10-CM | POA: Diagnosis not present

## 2018-09-14 MED ORDER — LACTATED RINGERS IV BOLUS
1000.0000 mL | Freq: Once | INTRAVENOUS | Status: AC
  Administered 2018-09-14: 1000 mL via INTRAVENOUS

## 2018-09-14 MED ORDER — SODIUM CHLORIDE 0.9% FLUSH
10.0000 mL | INTRAVENOUS | Status: AC | PRN
  Administered 2018-09-14: 10 mL

## 2018-09-14 MED ORDER — HEPARIN SOD (PORK) LOCK FLUSH 100 UNIT/ML IV SOLN
500.0000 [IU] | INTRAVENOUS | Status: AC | PRN
  Administered 2018-09-14: 500 [IU]
  Filled 2018-09-14: qty 5

## 2018-09-14 NOTE — Discharge Instructions (Signed)

## 2018-09-14 NOTE — Progress Notes (Signed)
Patient received 1 liter bolus of Lactated Ringers through right chest implanted port. Port accessed and deaccessed per protocol. Tolerated infusion well with no adverse reaction. Vital signs stable. Discharge instructions given. Patient alert, oriented and ambulatory at discharge.

## 2018-09-16 ENCOUNTER — Encounter (HOSPITAL_COMMUNITY): Payer: Managed Care, Other (non HMO)

## 2018-09-16 NOTE — Telephone Encounter (Signed)
Called and spoke with Gilman Schmidt (Gattex rep); RN informed that patient had recently seen Dr. Renee Harder and he advised the patient not to take Gattex and to remain on the Opium tincture at this time (since it was working); patient agreed with plan of care and notified the Gattex rep Stanton Kidney) that she wanted the case placed on hold for right now and would follow up as directed by Dr. Renee Harder;  RN requested Stanton Kidney to call Dr. Steve Rattler office back if the patient changed her mind and wanted to start on the [facility];  Mary verbalized understanding of information/instructions; Stanton Kidney was advised to call back if questions/concerns arise;

## 2018-09-19 ENCOUNTER — Encounter (HOSPITAL_COMMUNITY): Payer: Managed Care, Other (non HMO)

## 2018-09-21 ENCOUNTER — Encounter (HOSPITAL_COMMUNITY): Payer: Managed Care, Other (non HMO)

## 2018-09-23 ENCOUNTER — Encounter (HOSPITAL_COMMUNITY): Payer: Managed Care, Other (non HMO)

## 2018-09-26 ENCOUNTER — Encounter (HOSPITAL_COMMUNITY): Payer: Managed Care, Other (non HMO)

## 2018-09-28 ENCOUNTER — Encounter (HOSPITAL_COMMUNITY): Payer: Managed Care, Other (non HMO)

## 2018-09-30 ENCOUNTER — Encounter (HOSPITAL_COMMUNITY): Payer: Managed Care, Other (non HMO)

## 2018-10-03 ENCOUNTER — Ambulatory Visit (HOSPITAL_COMMUNITY)
Admission: RE | Admit: 2018-10-03 | Discharge: 2018-10-03 | Disposition: A | Discharge status: Home / Self Care | Payer: Managed Care, Other (non HMO) | Source: Ambulatory Visit | Attending: Gastroenterology | Admitting: Gastroenterology

## 2018-10-03 ENCOUNTER — Encounter (HOSPITAL_COMMUNITY): Payer: Managed Care, Other (non HMO)

## 2018-10-03 DIAGNOSIS — K912 Postsurgical malabsorption, not elsewhere classified: Secondary | ICD-10-CM | POA: Insufficient documentation

## 2018-10-03 DIAGNOSIS — K529 Noninfective gastroenteritis and colitis, unspecified: Secondary | ICD-10-CM | POA: Diagnosis not present

## 2018-10-03 DIAGNOSIS — K5090 Crohn's disease, unspecified, without complications: Secondary | ICD-10-CM | POA: Diagnosis not present

## 2018-10-03 MED ORDER — HEPARIN SOD (PORK) LOCK FLUSH 100 UNIT/ML IV SOLN
500.0000 [IU] | INTRAVENOUS | Status: AC | PRN
  Administered 2018-10-03: 500 [IU]

## 2018-10-03 MED ORDER — LACTATED RINGERS IV BOLUS
1000.0000 mL | Freq: Once | INTRAVENOUS | Status: AC
  Administered 2018-10-03: 1000 mL via INTRAVENOUS

## 2018-10-03 MED ORDER — SODIUM CHLORIDE 0.9% FLUSH
10.0000 mL | INTRAVENOUS | Status: AC | PRN
  Administered 2018-10-03: 10 mL

## 2018-10-03 NOTE — Discharge Instructions (Signed)

## 2018-10-03 NOTE — Progress Notes (Signed)
Patient received 1 liter bolus of LactatedRingersthrough a right  chest implanted port.Port accessed and deaccessed per protocol.Tolerated infusion well with no adverse reaction. Vital signs stable. Patient to call provider's office about lab draws. Discharge instructions given. Patient alert, oriented and ambulatory at discharge.

## 2018-10-05 ENCOUNTER — Encounter (HOSPITAL_COMMUNITY): Payer: Managed Care, Other (non HMO)

## 2018-10-07 ENCOUNTER — Encounter (HOSPITAL_COMMUNITY): Payer: Managed Care, Other (non HMO)

## 2018-10-10 ENCOUNTER — Encounter (HOSPITAL_COMMUNITY): Payer: Managed Care, Other (non HMO)

## 2018-10-12 ENCOUNTER — Encounter (HOSPITAL_COMMUNITY): Payer: Managed Care, Other (non HMO)

## 2018-10-14 ENCOUNTER — Encounter (HOSPITAL_COMMUNITY): Payer: Managed Care, Other (non HMO)

## 2018-10-24 ENCOUNTER — Telehealth: Payer: Self-pay | Admitting: Gastroenterology

## 2018-10-24 NOTE — Telephone Encounter (Signed)
Called and spoke with patient- patient reports the tincture of opium is "working Recruitment consultant but I need a refill?" patient has been scheduled for an OV on 10/25/2018 arrival at 3:45pm for a 4:00pm appt; patient is aware she may/may not be need to be seen for an office visit in order to have the medication refilled-she also understands that if the RX is sent in then she does not need to come in to be seen; no IV infusions have been set up as the patient is not having diarrhea-having appropriate bowel movement consistency  Please advise

## 2018-10-24 NOTE — Telephone Encounter (Signed)
Pt called in needing to sched infusions.

## 2018-10-24 NOTE — Telephone Encounter (Signed)
I believe she is doing well with tincture of opium Not having any more diarrhea  Can you please call and make sure. If she is not having any diarrhea, then hold off on IV fluids (since she is not losing any excessive fluids).

## 2018-10-24 NOTE — Telephone Encounter (Signed)
Please review MD OV note on 09/15/2018; Patient's last infusion of IV fluids was on 10/03/2018 and next infusions were either cancelled or no showed;  Do you recommend the patient receive IV fluids per infusion? please review previous message and advise;

## 2018-10-25 ENCOUNTER — Ambulatory Visit: Payer: Managed Care, Other (non HMO) | Admitting: Gastroenterology

## 2018-11-17 ENCOUNTER — Encounter: Payer: Self-pay | Admitting: Gastroenterology

## 2018-11-23 ENCOUNTER — Telehealth (INDEPENDENT_AMBULATORY_CARE_PROVIDER_SITE_OTHER): Payer: Managed Care, Other (non HMO) | Admitting: Gastroenterology

## 2018-11-23 ENCOUNTER — Other Ambulatory Visit: Payer: Self-pay

## 2018-11-23 ENCOUNTER — Encounter: Payer: Self-pay | Admitting: Gastroenterology

## 2018-11-23 DIAGNOSIS — K912 Postsurgical malabsorption, not elsewhere classified: Secondary | ICD-10-CM | POA: Diagnosis not present

## 2018-11-23 DIAGNOSIS — K50919 Crohn's disease, unspecified, with unspecified complications: Secondary | ICD-10-CM

## 2018-11-23 MED ORDER — OPIUM 10 MG/ML (1%) PO TINC: 6.0000 mg | Freq: Three times a day (TID) | ORAL | 0 refills | Status: DC

## 2018-11-23 NOTE — Patient Instructions (Addendum)
-   Continue Tincture of opium (25m/ml) 0.635mq6hrs prn. # 1 bottle 11839m She has quit smoking. - Wants to avoid IVF d/t Covid. IV RL 1 lit thru port every 2 weekly.  Will adjust electrolytes depending upon the blood tests. - blood work report from Dr [facility]. Robinson [employer]. - FU e-visit 12 weeks. No indication for steroids at the present time since last EGD and colonoscopy did not show any significant inflammation.

## 2018-11-23 NOTE — Progress Notes (Signed)
Chief Complaint: FU   Referring Provider:  [name], NP      ASSESSMENT AND PLAN;   #1.  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 S/P port placement -currently undergoing IVF replacement PRN ever since she has been on tincture of opium.  Failed multiple agents including Imodium, Lomotil, cholestyramine.  Has done very well with tincture of opium.  It is difficult to ascertain how much small bowel she has. #2.  Crohn's disease: Dx at age 43,([place], [place]), s/p R hemicolectomy with TI resection at age 46, s/p L colon resection due to abscess, then colostomy d/t rectal prolapse 2017. failed Humira, Cimzia, Remicade and most recently Entyvio.  Has been to multiple gastroenterologists at multiple centers.  Multiple admissions.  Most recently at [facility] transferred to [facility] S/P EGD and colon (Dr Dorrene German) 02/21/2018 (Nl EGD, neg colon to neo-TI, neg Bx). CT 02/2018: skip lesions but minimal inflammation. #3.  Cyclical N/V/abdominal pain. Neg EGD, neg CT neg for SBO 11/17/2018 at [facility] (report sent for scanning) #4.  Comorbid conditions including anxiety/depression, asymptomatic cholelithiasis.  Plan: - Continue Tincture of opium (63m/ml) 0.637mq6hrs prn. # 1 bottle 11841m She has almost quit smoking. - Wants to avoid IVF d/t Covid. IV RL 1 lit thru port every 2 weekly.  Will adjust electrolytes depending upon the blood tests. - Blood work report from Dr [facility]. [employer]. - FU e-visit 12 weeks. No indication for steroids at the present time since last EGD and colonoscopy did not show any significant inflammation.  HPI:    SheAZALIA Simpson a [age]o. female  Very complicated unfortunate patient Has been to multiple gastroenterologists at multiple centers. with history of Crohn's disease- Dx at age 43,([place], [place]), status post right hemicolectomy with TI resection at the age [age] [facility] to [facility], Cimzia, Remicade and most  recently Entyvio.  Has been to multiple gastroenterologists at multiple centers.  Multiple admissions.  [facility] (Dr RhoDorrene German/03/2018 (Nl EGD, colon normal neo-TI and colon, neg Bx). CT 02/2018: skip lesions but minimal inflammation.  Had another episode of nausea/vomiting/abdominal pain.  Underwent CT scan Abdo/pelvis on 11/17/2018 -negative for small bowel obstruction.  Did show cholelithiasis without any inflammation.  She was given empiric antibiotics which she did not take.  Doing very well on tincture of opium.  She recently ran out and then again started having ostomy output of approximately 2 L/day.  While on tincture of opium, she had semisolid bowel movements 6-8 times per day.  She has not required IV fluids since she has been on tincture of opium.  No further nausea/vomiting.  She always had elevated WBC count, platelet count.  Hemoglobin has been normal.  She has gained weight.  No need for Gattex per Dr. BloKoleen DistanceShe has almost quit smoking.  There were no vitals filed for this visit.    Past Medical History:  Diagnosis Date  . Anxiety   . Cro[place]'s disease ([facility] . Herpes   . Hypokalemia   . Insomnia   . Lyme disease   . Major depressive disorder   . Migraines   . Short bowel syndrome   . Tachycardia   . Vitamin B12 deficiency     Past Surgical History:  Procedure Laterality Date  . ABDOMINAL HYSTERECTOMY    . APPENDECTOMY  1990s  . COLOSTOMY  2017  Dr Carmell Austria, [place], [place]  . IR CV LINE INJECTION  08/03/2018  . PORTACATH PLACEMENT Left 06/15/2018   Procedure: INSERTION PORT-A-CATH UNDER ULTRASOUND AND FLUROSCOPIC GUIDANCE;  Surgeon: Michael Boston, MD;  Location: [facility];  Service: General;  Laterality: Left;  . RIGHT COLECTOMY  2015   Dr Carrington Clamp.  Resection of ileocolonic anastomotic stricture.     Family History  Adopted: Yes  Family history unknown: Yes     Social History   Tobacco Use  . Smoking status: Current Every Day Smoker    Packs/day: 0.25    Types: Cigarettes  . Smokeless tobacco: Never Used  Substance Use Topics  . Alcohol use: No  . Drug use: No    Current Outpatient Medications  Medication Sig Dispense Refill  . AMBULATORY NON FORMULARY MEDICATION Inject 1,000 mLs into the vein 3 (three) times a week. Medication Name: IV NS with 40 meq Potassium infuse over 3 hours; give 3 times per week for 3 months; port flushes per pharmacy protocol; may flush port on date of order and draw lab work-CBC,CMP,Magnesium; 36 Dose 0  . cephALEXin (KEFLEX) 500 MG capsule Take 500 mg by mouth every 8 (eight) hours. X 7 days    . clonazePAM (KLONOPIN) 0.5 MG tablet Take 0.5 mg by mouth 2 (two) times daily as needed for anxiety.     . diphenhydrAMINE (BENADRYL) 25 MG tablet Take 25 mg by mouth daily as needed for allergies.    . Flibanserin (ADDYI) 100 MG TABS Take by mouth.    . metroNIDAZOLE (FLAGYL) 500 MG tablet Take 500 mg by mouth 3 (three) times daily. X 7 days    . Opium 10 MG/ML (1%) TINC Take 0.6 mLs (6 mg total) by mouth every 6 (six) hours as needed for diarrhea or loose stools. 1 Bottle 0  . promethazine (PHENERGAN) 25 MG tablet Take 1 tablet (25 mg total) by mouth every 8 (eight) hours as needed for nausea or vomiting. 30 tablet 2  . QNASL 80 MCG/ACT AERS Place 1 spray into the nose as needed (congestion).   0  . SUMAtriptan (IMITREX) 25 MG tablet Take 25 mg by mouth as needed for migraine.     . traZODone (DESYREL) 100 MG tablet Take 100 mg by mouth at bedtime.     . vortioxetine HBr (TRINTELLIX) 20 MG TABS tablet Take 20 mg by mouth daily.     No current facility-administered medications for this visit.     Allergies  Allergen Reactions  . Doxycycline Nausea And Vomiting    Review of Systems:  Neg except HPI     Physical Exam:    Ht 5' 6"  (1.676 m)   BMI 22.76 kg/m  There were no vitals filed for this visit. Not  examined since it was a tele-visit.   This service was provided via telemedicine.  The patient was located at home.  The provider was located in office.  The patient did consent to this telephone visit and is aware of possible charges through their insurance for this visit.   Time spent on call and coordination of care: 25 min    Carmell Austria, MD 11/23/2018, 11:00 AM  Cc: [name], NP

## 2018-11-24 ENCOUNTER — Telehealth: Payer: Self-pay | Admitting: Gastroenterology

## 2018-11-24 NOTE — Telephone Encounter (Signed)
She started vomiting yesterday. Last vomited around 3:30 PCP has given her promethazine. Discussed hydration. She says she feels really bad. T99.6. Can you reach out to her?

## 2018-11-24 NOTE — Telephone Encounter (Signed)
Pt states that she woke up this morning vomiting and with fever, she would like some advise.

## 2018-11-28 NOTE — Telephone Encounter (Signed)
I have called her this afternoon. She feels much better No significant GI complaints Fever has resolved.

## 2019-01-17 ENCOUNTER — Telehealth: Payer: Self-pay | Admitting: Gastroenterology

## 2019-01-17 NOTE — Telephone Encounter (Signed)
Pt called in stating she has one syringe left of medication and is needing a refill.

## 2019-01-18 NOTE — Telephone Encounter (Signed)
Pl do. I believe this is the dose. Pl make sure Tincture of opium (72m/ml) 0.660mq6hrs prn. # 1 bottle 11865m2 refills

## 2019-01-18 NOTE — Telephone Encounter (Signed)
Would you like to send in a refill?

## 2019-01-19 ENCOUNTER — Other Ambulatory Visit: Payer: Self-pay

## 2019-01-19 MED ORDER — OPIUM 10 MG/ML (1%) PO TINC: 6.0000 mg | Freq: Four times a day (QID) | ORAL | 0 refills | Status: DC | PRN

## 2019-01-19 NOTE — Telephone Encounter (Signed)
Unable to write an rx with refills for a schedule II control. Notified patient that we have sent in a refill for her and to notify us when she begins to run low so we can send in another.

## 2019-02-07 ENCOUNTER — Ambulatory Visit (INDEPENDENT_AMBULATORY_CARE_PROVIDER_SITE_OTHER): Payer: 59 | Admitting: Licensed Clinical Social Worker

## 2019-02-07 DIAGNOSIS — F332 Major depressive disorder, recurrent severe without psychotic features: Secondary | ICD-10-CM | POA: Diagnosis not present

## 2019-02-13 ENCOUNTER — Ambulatory Visit (HOSPITAL_COMMUNITY): Payer: 59 | Admitting: Licensed Clinical Social Worker

## 2019-02-16 ENCOUNTER — Encounter (HOSPITAL_COMMUNITY): Payer: Self-pay | Admitting: Licensed Clinical Social Worker
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
## 2019-02-21 ENCOUNTER — Encounter: Payer: Self-pay | Admitting: Gastroenterology

## 2019-02-21 ENCOUNTER — Telehealth: Payer: Self-pay | Admitting: Gastroenterology

## 2019-02-21 NOTE — Telephone Encounter (Signed)
Patient called said that she would like to speak to someone because her labs are to high and low and needs to know if she should have a IV infusion through the port. She would like to know if Dr. Lyndel Safe could talk to her PCP.

## 2019-02-22 NOTE — Telephone Encounter (Signed)
Called and spoke with patient-patient reports she had lab work complete at [facility] and was informed her Vitamin D level was low and was prescribed an oral Vitamin D supplement; patient also stated she was told her Magnesium level was also abnormal but was not told the level; patient also reports she was told her triglyceride level was "astronomically high" even though the patient fasted for at least 12 hours; patient is requesting an IV infusion to correct her abnormal labs;  Called and spoke with [facility], Lenna Sciara, as Raquel Sarna, NP is off this morning; Melissa stated that the patient's Magnesium level was reported by the lab to be normal, the Vitamin D level was a little low and the patient was prescribed supplement Vitamin D 50,000 IU twice a week PO; Melissa states she will fax the lab results to the office for Dr. Lyndel Safe to review;  Lenna Sciara also states she will send Raquel Sarna, NP a message to contact the patient as there is NO notation in the patient's chart of an IV infusion being ordered or recommended by Raquel Sarna, NP;    [name],  Please locate the lab work that is being faxed to the office and leave for Dr. Lyndel Safe to review-thank you  Dr. Lyndel Safe, Please review lab results when they become available to you and then please advise on next step in plan of care;

## 2019-02-22 NOTE — Telephone Encounter (Signed)
Left message for patient to call back to the office at [phone]; will attempt to reach patient at a later date/time;

## 2019-02-22 NOTE — Telephone Encounter (Signed)
I have received the labs and put them on Dr. Leland Her desk for review.

## 2019-02-24 NOTE — Telephone Encounter (Signed)
We have received labs from [place] drawn 02/16/2019.  -Triglycerides 617, total cholesterol 228. Pl have Dr Jannette Fogo manage these.  May need TriCor. HBA1c 5.4 -CBC looks good with hemoglobin 13.2,  -Vitamin D 10.4-okay to start taking vitamin D as prescribed by Dr Jannette Fogo. -CMP looks good.  Alk phos 141, normal potassium 4.9, normal albumin 4.4, Cr 1.2, Nl TSH 1.6 -Magnesium is normal at 1.9.  So, currently keep on the same medications from GI standpoint. Please get in touch with Dr. Jannette Fogo for management of elevated cholesterol and triglycerides.  RG

## 2019-02-27 NOTE — Telephone Encounter (Signed)
Called and spoke with patient-patient informed of MD recommendations; patient is agreeable with plan of care and verified PCP; result note routed to PCP per MD recommendations; Patient verbalized understanding of information/instructions;  Patient was advised to call the office at [phone] if questions/concerns arise;

## 2019-03-01 ENCOUNTER — Telehealth: Payer: Self-pay | Admitting: Gastroenterology

## 2019-03-01 NOTE — Telephone Encounter (Signed)
Thx for letting me know In fact, they have Epic and can access the notes. RG

## 2019-03-01 NOTE — Telephone Encounter (Signed)
Please see previous message

## 2019-03-08 ENCOUNTER — Other Ambulatory Visit: Payer: Self-pay

## 2019-03-08 ENCOUNTER — Telehealth (INDEPENDENT_AMBULATORY_CARE_PROVIDER_SITE_OTHER): Payer: Managed Care, Other (non HMO) | Admitting: Gastroenterology

## 2019-03-08 ENCOUNTER — Encounter: Payer: Self-pay | Admitting: Gastroenterology

## 2019-03-08 VITALS — Ht 66.0 in | Wt 159.0 lb

## 2019-03-08 DIAGNOSIS — K912 Postsurgical malabsorption, not elsewhere classified: Secondary | ICD-10-CM

## 2019-03-08 DIAGNOSIS — K50919 Crohn's disease, unspecified, with unspecified complications: Secondary | ICD-10-CM

## 2019-03-08 NOTE — Patient Instructions (Signed)
If you are age 45 or older, your body mass index should be between 23-30. Your Body mass index is 25.66 kg/m. If this is out of the aforementioned range listed, please consider follow up with your Primary Care Provider.  If you are age 33 or younger, your body mass index should be between 19-25. Your Body mass index is 25.66 kg/m. If this is out of the aformentioned range listed, please consider follow up with your Primary Care Provider.   Follow up 12 weeks  Thank you,  Dr. Jackquline Denmark

## 2019-03-08 NOTE — Progress Notes (Signed)
Chief Complaint: FU   Referring Provider:  [name], NP      ASSESSMENT AND PLAN;   #1.  Short gut syndrome. S/P port placement - diarrhea well controlled on tincture of opium.  Failed multiple agents including Imodium, Lomotil, cholestyramine. It is difficult to ascertain how much small bowel she has.  Currently undergoing insurance authorization for possible intestinal transplant at [facility] clinic. No need for Gattex.  #2.  Crohn's disease (appears to be '"burnt out"): Dx at age 33,([place], [place]), s/p R hemicolectomy with TI resection at age 45, s/p L colon resection d/t abscess, then colostomy d/t rectal prolapse 2017. Failed Humira, Cimzia, Remicade and Entyvio.  Has been to multiple gastroenterologists at multiple centers.  Multiple admissions in past.  EGD and colon (Dr Dorrene German) 02/21/2018 (Nl EGD, neg colon to neo-TI, neg Bx). CT 02/2018: skip lesions but minimal inflammation. Neg CT 11/2018.  #3.  Cyclical N/V/abdominal pain (resolved). Neg EGD, neg CT neg for SBO 11/17/2018 at [facility].  #4.  Comorbid conditions including anxiety/depression, asymptomatic cholelithiasis, HLD with elevated TG.  #5.  B12 deficiency.  Plan: -Continue Tincture of opium (3m/ml) 0.637mQD. # 1 bottle 11836m-Has quit smoking and THC since March 01, 2019. -[facility]. -Has gained 25lb over last 2 months.  I have encouraged her to cut down on sodas, start exercising, and maintain weight.  She has stationary bikes at home. [facility] and heparin flush.  Has not been done since Jan 2020 d/t COVID-19.  [facility]. Pt's husband can administer. -She will get blood work done from Dr HaqC[employer]H. Robinson Worldwidefice in 2 weeks.  She will get in touch with Dr. HaqJannette FogoFU e-visit 12 weeks.   HPI:    SheMIRABEL Simpson a 43 81o. female  Very complicated patient, has very supportive husband Has been to  multiple gastroenterologists at multiple centers. with history of Crohn's disease- Dx at age 33,([place], [place]), status post right hemicolectomy with TI resection at the age [age]tatus [facility] to [facility] C[facility], Remicade and most recently Entyvio.  Has been to multiple gastroenterologists at multiple centers.  Multiple admissions.  [facility] (Dr RhoDorrene German/03/2018 (Nl EGD, colon normal neo-TI and colon, neg Bx). CT 02/2018: skip lesions but minimal inflammation. CT scan Abdo/pelvis on 11/17/2018 -negative for small bowel obstruction.  Did show cholelithiasis without any inflammation.   Doing very well on tincture of opium 0.6 ml once a day.  If she takes more than once a day, she gets constipated.   Last IV fluids on 10/03/2018. None thereafter.  No further nausea/vomiting.  She always had elevated WBC count, platelet count.  Hemoglobin has been normal.  She has gained 25 pounds ever since she quit smoking and THC March 01, 2019.  [facility]  Currently at very initial stages of insurance approval.  No need for Gattex per Dr. BloKoleen DistanceAlso started on Crestor for elevated cholesterol.  She also has elevated triglycerides 617 on 02/16/2019  Other labs: 02/21/2019: Magnesium 1.9, globin 13, hemoglobin A1c 5.4, B12 borderline low at 233.  Potassium 4.9, creatinine 1.22.  Normal TSH  Filed Weights   03/08/19 0813  Weight: 159 lb (72.1 kg)      Past Medical History:  Diagnosis Date  . Anxiety   . Crohn's disease ([place])   . Herpes   . Hypokalemia   . Insomnia   . Lyme disease   . Major depressive disorder   . Migraines   . Short bowel syndrome   . Tachycardia   . Vitamin B12 deficiency     Past Surgical History:  Procedure Laterality Date  . ABDOMINAL HYSTERECTOMY    . APPENDECTOMY  1990s  . COLOSTOMY  2017   Dr  Carmell Austria, [place], [place]  . IR CV LINE INJECTION  08/03/2018  . PORTACATH PLACEMENT Left 06/15/2018   Procedure: INSERTION PORT-A-CATH UNDER ULTRASOUND AND FLUROSCOPIC GUIDANCE;  Surgeon: Michael Boston, MD;  Location: [facility];  Service: General;  Laterality: Left;  . RIGHT COLECTOMY  2015   Dr Carrington Clamp.  Resection of ileocolonic anastomotic stricture.     Family History  Adopted: Yes  Family history unknown: Yes    Social History   Tobacco Use  . Smoking status: Current Every Day Smoker    Packs/day: 0.25    Types: Cigarettes  . Smokeless tobacco: Never Used  Substance Use Topics  . Alcohol use: No  . Drug use: No    Current Outpatient Medications  Medication Sig Dispense Refill  . AMBULATORY NON FORMULARY MEDICATION Inject 1,000 mLs into the vein 3 (three) times a week. Medication Name: IV NS with 40 meq Potassium infuse over 3 hours; give 3 times per week for 3 months; port flushes per pharmacy protocol; may flush port on date of order and draw lab work-CBC,CMP,Magnesium; 36 Dose 0  . clonazePAM (KLONOPIN) 0.5 MG tablet Take 0.5 mg by mouth 2 (two) times daily as needed for anxiety.     . diphenhydrAMINE (BENADRYL) 25 MG tablet Take 25 mg by mouth daily as needed for allergies.    . Flibanserin (ADDYI) 100 MG TABS Take by mouth.    . Opium 10 MG/ML (1%) TINC Take 0.6 mLs (6 mg total) by mouth every 6 (six) hours as needed for diarrhea or loose stools. 1 Bottle 0  . pantoprazole (PROTONIX) 40 MG tablet Take 40 mg by mouth daily.    . promethazine (PHENERGAN) 25 MG tablet Take 1 tablet (25 mg total) by mouth every 8 (eight) hours as needed for nausea or vomiting. 30 tablet 2  . QNASL 80 MCG/ACT AERS Place 1 spray into the nose as needed (congestion).   0  . rosuvastatin (CRESTOR) 20 MG tablet Take 20 mg by mouth daily.    . SUMAtriptan (IMITREX) 25 MG tablet Take 25 mg by mouth as needed for migraine.     . traZODone (DESYREL) 100 MG tablet Take  100 mg by mouth at bedtime.     . Vitamin D, Ergocalciferol, (DRISDOL) 1.25 MG (50000 UT) CAPS capsule Take 50,000 Units by mouth every 7 (seven) days.    Marland Kitchen vortioxetine HBr (TRINTELLIX) 20 MG TABS tablet Take 20 mg by mouth daily.     No current facility-administered medications for this visit.     Allergies  Allergen Reactions  . Doxycycline Nausea And Vomiting    Review of Systems:  Neg except HPI     Physical Exam:    Ht 5' 6"  (1.676 m)   Wt 159 lb (72.1 kg)   BMI 25.66 kg/m  Filed Weights   03/08/19 0813  Weight: 159 lb (72.1 kg)   Not examined since it was a Doxy video visit  This service was provided via Doxy video visit.  The patient was located at home.  The provider was located in office.  The patient did consent to this telephone visit and is aware of possible charges through their insurance for this visit.  Patient's husband also participated briefly.  Time spent on call and coordination of care: 25 min    Carmell Austria, MD 03/08/2019, 8:54 AM  Cc: [name], NP

## 2019-03-13 ENCOUNTER — Telehealth: Payer: Self-pay | Admitting: *Deleted

## 2019-03-13 MED ORDER — CYANOCOBALAMIN 1000 MCG/ML IJ SOLN: 1000.0000 ug | INTRAMUSCULAR | 3 refills | Status: DC

## 2019-03-13 MED ORDER — HEPARIN SOD (PORK) LOCK FLUSH 100 UNIT/ML IV SOLN: 500.0000 [IU] | INTRAVENOUS | Status: DC

## 2019-03-13 NOTE — Addendum Note (Signed)
Addended by: Mohammed Kindle on: 03/13/2019 03:57 PM   Modules accepted: Orders

## 2019-03-13 NOTE — Telephone Encounter (Signed)
I have sent prescription to patients prescription to her pharmacy. Patients information has been sent to the [facility] [place] for the heparin flush.

## 2019-03-14 ENCOUNTER — Telehealth: Payer: Self-pay | Admitting: Gastroenterology

## 2019-03-14 NOTE — Telephone Encounter (Signed)
Faxed to [facility] [place].

## 2019-03-14 NOTE — Telephone Encounter (Signed)
Pt reported that she has used the bathroom over 50 times and has lost 3.5 lbs within the past 24 h.  She mentioned that "everything coming out of my mouth or ostomy bag is pure bile."  Pt stated that she is scheduled for a heparin flush at the [facility] [place].  She would like Dr. Lyndel Safe to order blood works and IV fluids as well.

## 2019-03-14 NOTE — Telephone Encounter (Signed)
Dr. Lyndel Safe patient DOD=Please review patient message  Called and spoke with patient-patient reports she has been using Phenergan for the nausea/vomiting and Imodium has never worked for her diarrhea; patient encouraged to keep pushing po fluids and to use Phenergan as needed;  Please advise

## 2019-03-15 ENCOUNTER — Other Ambulatory Visit: Payer: Self-pay

## 2019-03-15 ENCOUNTER — Ambulatory Visit (HOSPITAL_COMMUNITY)
Admission: RE | Admit: 2019-03-15 | Discharge: 2019-03-15 | Disposition: A | Discharge status: Home / Self Care | Payer: Managed Care, Other (non HMO) | Source: Ambulatory Visit | Attending: Gastroenterology | Admitting: Gastroenterology

## 2019-03-15 DIAGNOSIS — Z452 Encounter for adjustment and management of vascular access device: Secondary | ICD-10-CM | POA: Insufficient documentation

## 2019-03-15 DIAGNOSIS — K912 Postsurgical malabsorption, not elsewhere classified: Secondary | ICD-10-CM

## 2019-03-15 MED ORDER — HEPARIN SOD (PORK) LOCK FLUSH 100 UNIT/ML IV SOLN
500.0000 [IU] | INTRAVENOUS | Status: AC | PRN
  Administered 2019-03-15: 500 [IU]
  Filled 2019-03-15: qty 5

## 2019-03-15 MED ORDER — SODIUM CHLORIDE 0.9% FLUSH
10.0000 mL | INTRAVENOUS | Status: AC | PRN
  Administered 2019-03-15: 10 mL

## 2019-03-15 MED ORDER — ALTEPLASE 2 MG IJ SOLR
2.0000 mg | Freq: Once | INTRAMUSCULAR | Status: DC
  Filled 2019-03-15 (×2): qty 2

## 2019-03-15 NOTE — Telephone Encounter (Signed)
Patient returned call to the office-patient informed of DOD MD recommendations-patient reports she "knows what works for her body and I am not going to waste my money on things I know do not work for me- I mean no disrespect towards that on-call doctor but you can let him know I will not be taking the Lomotil or the Zofran; RX's were NOT sent to pharmacy per patient request due to her not going to use them; patient will continue what she is doing and call back if she changes her mind; patient advised to call back to the office should her symptoms worsen or she has questions/concerns;

## 2019-03-15 NOTE — Progress Notes (Signed)
Patient care center called and states the patient is needing to have an order for Alteplase due to the porta-cath not being able to be flushed after multiple attempts; order received from DOD and patient care center has been made aware of order in Epic;

## 2019-03-15 NOTE — Addendum Note (Signed)
Addended by: Mohammed Kindle on: 03/15/2019 02:35 PM   Modules accepted: Orders, SmartSet

## 2019-03-15 NOTE — Progress Notes (Signed)
Order for Heparin flush for port-a-cath needed for [name]

## 2019-03-15 NOTE — Telephone Encounter (Signed)
Clarification of Zofran order please -PRN? #? Refills?

## 2019-03-15 NOTE — Telephone Encounter (Signed)
zofran ODT 8 mg, 1 tab sublingual, every 8 hours as needed for nausea/vomiting; #90, no refill

## 2019-03-15 NOTE — Progress Notes (Signed)
                   [name]    Provider: Gerrit Heck, V. DO   Procedure: Port-a-Cath Flush   Simpson: Patient's PAC flushed with 0.9% normal saline and heparin. Blood return noted very sluggish.  Attempted several times to flush and did not get a blood return. Patient de-accessed. Shortly after received orders for alteplase on PAC.  Port re-accessed and blood return noted.  Alteplase was not used.  Patient's PAC was flush with 0/9% saline and heparin. Tolerated well. Discharge instructions given. Patient alert, oriented and ambulatory at discharge.  Otho Bellows, RN

## 2019-03-15 NOTE — Telephone Encounter (Signed)
Left message for patient to call back to the office;  

## 2019-03-15 NOTE — Telephone Encounter (Signed)
Per Dr. Steve Rattler recent note she is using tincture of opium which she should continue.  Can add Lomotil, I know this is been tried in the past but we will not her to try again 2 tablets 4 times daily. Can add Zofran 8 mg every 8 hours, sublingual to Phenergan which is currently being used for nausea If she continues to lose weight she may need IV fluids; may need ER if worsening

## 2019-04-03 ENCOUNTER — Telehealth: Payer: Self-pay | Admitting: Gastroenterology

## 2019-04-03 NOTE — Telephone Encounter (Signed)
patient is wanting to give updates regarding [facility] clinic.   Patient states that she spoke to surgeon today and she will be leaving on 8/30 to go to [facility] for the transplant. Patient states that the surgeon may need additional records or testing from Dr. Antionette Char so, then that office will be in contact with our office).

## 2019-04-06 NOTE — Telephone Encounter (Signed)
Thanks for letting me know Wish her all the best  RG

## 2019-04-06 NOTE — Telephone Encounter (Signed)
Left message for patient to call back to the office;  Otherwise, information is being sent to Dr. Lyndel Safe as Juluis Rainier

## 2019-04-15 ENCOUNTER — Encounter (HOSPITAL_COMMUNITY): Payer: Self-pay

## 2019-04-15 ENCOUNTER — Emergency Department (HOSPITAL_COMMUNITY)
Admission: EM | Admit: 2019-04-15 | Discharge: 2019-04-15 | Disposition: A | Discharge status: Home / Self Care | Payer: Managed Care, Other (non HMO) | Attending: Emergency Medicine | Admitting: Emergency Medicine

## 2019-04-15 ENCOUNTER — Other Ambulatory Visit: Payer: Self-pay

## 2019-04-15 DIAGNOSIS — E782 Mixed hyperlipidemia: Secondary | ICD-10-CM | POA: Diagnosis not present

## 2019-04-15 DIAGNOSIS — Z881 Allergy status to other antibiotic agents status: Secondary | ICD-10-CM | POA: Insufficient documentation

## 2019-04-15 DIAGNOSIS — M069 Rheumatoid arthritis, unspecified: Secondary | ICD-10-CM | POA: Diagnosis not present

## 2019-04-15 DIAGNOSIS — R519 Headache, unspecified: Secondary | ICD-10-CM

## 2019-04-15 DIAGNOSIS — R51 Headache: Secondary | ICD-10-CM | POA: Diagnosis not present

## 2019-04-15 DIAGNOSIS — F17210 Nicotine dependence, cigarettes, uncomplicated: Secondary | ICD-10-CM | POA: Insufficient documentation

## 2019-04-15 DIAGNOSIS — E860 Dehydration: Secondary | ICD-10-CM | POA: Diagnosis not present

## 2019-04-15 DIAGNOSIS — Z79899 Other long term (current) drug therapy: Secondary | ICD-10-CM | POA: Insufficient documentation

## 2019-04-15 LAB — ETHANOL: Alcohol, Ethyl (B): <10 mg/dL (ref <10)

## 2019-04-15 LAB — URINALYSIS, ROUTINE W REFLEX MICROSCOPIC
Bacteria, UA: NONE SEEN
Bilirubin Urine: NEGATIVE
Glucose, UA: NEGATIVE mg/dL
Hgb urine dipstick: NEGATIVE
Ketones, ur: NEGATIVE mg/dL
Nitrite: NEGATIVE
Protein, ur: NEGATIVE mg/dL
Specific Gravity, Urine: 1.012 (ref 1.005–1.030)
pH: 5 (ref 5.0–8.0)

## 2019-04-15 LAB — CBC WITH DIFFERENTIAL/PLATELET
Abs Immature Granulocytes: 0.02 10*3/uL (ref 0.00–0.07)
Basophils Absolute: 0 10*3/uL (ref 0.0–0.1)
Basophils Relative: 0 %
Eosinophils Absolute: 0.1 10*3/uL (ref 0.0–0.5)
Eosinophils Relative: 1 %
HCT: 36 % (ref 36.0–46.0)
Hemoglobin: 11.6 g/dL — ABNORMAL LOW (ref 12.0–15.0)
Immature Granulocytes: 0 %
Lymphocytes Relative: 27 %
Lymphs Abs: 3.2 10*3/uL (ref 0.7–4.0)
MCH: 29.1 pg (ref 26.0–34.0)
MCHC: 32.2 g/dL (ref 30.0–36.0)
MCV: 90.2 fL (ref 80.0–100.0)
Monocytes Absolute: 0.6 10*3/uL (ref 0.1–1.0)
Monocytes Relative: 5 %
Neutro Abs: 8.1 10*3/uL — ABNORMAL HIGH (ref 1.7–7.7)
Neutrophils Relative %: 67 %
Platelets: 389 10*3/uL (ref 150–400)
RBC: 3.99 MIL/uL (ref 3.87–5.11)
RDW: 12.7 % (ref 11.5–15.5)
WBC: 12 10*3/uL — ABNORMAL HIGH (ref 4.0–10.5)
nRBC: 0 % (ref 0.0–0.2)

## 2019-04-15 LAB — COMPREHENSIVE METABOLIC PANEL
ALT: 15 U/L (ref 0–44)
AST: 17 U/L (ref 15–41)
Albumin: 3.4 g/dL — ABNORMAL LOW (ref 3.5–5.0)
Alkaline Phosphatase: 86 U/L (ref 38–126)
Anion gap: 7 (ref 5–15)
BUN: 10 mg/dL (ref 6–20)
CO2: 20 mmol/L — ABNORMAL LOW (ref 22–32)
Calcium: 8.3 mg/dL — ABNORMAL LOW (ref 8.9–10.3)
Chloride: 113 mmol/L — ABNORMAL HIGH (ref 98–111)
Creatinine, Ser: 1.12 mg/dL — ABNORMAL HIGH (ref 0.44–1.00)
GFR calc Af Amer: >60 mL/min (ref >60)
GFR calc non Af Amer: 59 mL/min — ABNORMAL LOW (ref >60)
Glucose, Bld: 83 mg/dL (ref 70–99)
Potassium: 3.6 mmol/L (ref 3.5–5.1)
Sodium: 140 mmol/L (ref 135–145)
Total Bilirubin: 0.1 mg/dL — ABNORMAL LOW (ref 0.3–1.2)
Total Protein: 6.3 g/dL — ABNORMAL LOW (ref 6.5–8.1)

## 2019-04-15 LAB — LIPASE, BLOOD: Lipase: 93 U/L — ABNORMAL HIGH (ref 11–51)

## 2019-04-15 MED ORDER — DIPHENHYDRAMINE HCL 50 MG/ML IJ SOLN
25.0000 mg | Freq: Once | INTRAMUSCULAR | Status: AC
  Administered 2019-04-15: 19:00:00 25 mg via INTRAVENOUS
  Filled 2019-04-15: qty 1

## 2019-04-15 MED ORDER — KETOROLAC TROMETHAMINE 15 MG/ML IJ SOLN
15.0000 mg | Freq: Once | INTRAMUSCULAR | Status: AC
  Administered 2019-04-15: 19:00:00 15 mg via INTRAVENOUS
  Filled 2019-04-15: qty 1

## 2019-04-15 MED ORDER — METOCLOPRAMIDE HCL 5 MG/ML IJ SOLN
10.0000 mg | Freq: Once | INTRAMUSCULAR | Status: AC
  Administered 2019-04-15: 19:00:00 10 mg via INTRAVENOUS
  Filled 2019-04-15: qty 2

## 2019-04-15 MED ORDER — PROCHLORPERAZINE EDISYLATE 10 MG/2ML IJ SOLN
10.0000 mg | Freq: Once | INTRAMUSCULAR | Status: AC
  Administered 2019-04-15: 18:00:00 10 mg via INTRAVENOUS
  Filled 2019-04-15: qty 2

## 2019-04-15 NOTE — ED Provider Notes (Signed)
[facility] DEPT Provider Note   CSN: [id number] Arrival date & time: 04/15/19  1657     History   Chief Complaint Chief Complaint  Patient presents with  . Headache  . Dehydration    HPI Stacey Simpson is a 43 y.o. female.     HPI Patient with multiple medical problems presents with concern of weakness, possible irregular heartbeat. Patient has multiple complaints, including weakness as above, headache, left-sided, similar to migraines, but more severe, persistent sense of dehydration, with increased production of liquidy stool in her colostomy bag. No focal pain beyond her headache, no focal weakness, no fever. Patient's history of Crohn's disease includes prior surgeries, and she is reportedly scheduled for consideration of bowel transplant next month. Patient had stress cardiology evaluation performed 2 days ago, reportedly normal, though with tachycardia that improved reportedly quickly. No recent change in medication, diet.  EMS reports the patient was tachycardic on arrival, received fluid resuscitation. Past Medical History:  Diagnosis Date  . Anxiety   . Crohn's disease ([place])   . Herpes   . Hypokalemia   . Insomnia   . Lyme disease   . Major depressive disorder   . Migraines   . Short bowel syndrome   . Tachycardia   . Vitamin B12 deficiency     Patient Active Problem List   Diagnosis Date Noted  . High output ileostomy ([place])   . Short gut syndrome 08/15/2018  . Dehydration   . 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 Colostomy care ([place]) 06/15/2018  . Anxiety 06/15/2018  . Rheumatoid arthritis ([place]) 06/15/2018  . Malabsorption 06/15/2018  . Osteopenia 06/15/2018  . Hypercholesteremia 06/15/2018  . Chronic diarrhea 06/15/2018  . Crohn's disease of both small and large intestine with other complication ([place]) 59/16/3846  . 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/18/2018  . Major depression, recurrent ([place])  07/17/2017  . Tobacco use disorder 04/21/2017  . Malnutrition ([place]) 03/08/2017  . Loss of appetite 03/08/2017  . Palpitations 04/30/2016  . Headache, chronic daily 10/19/2014  . Crohn's disease ([place]) 03/15/2013  . Carpal tunnel syndrome, bilateral 12/15/2012    Past Surgical History:  Procedure Laterality Date  . ABDOMINAL HYSTERECTOMY    . APPENDECTOMY  1990s  . COLOSTOMY  2017   Dr Carmell Austria, [place], [place]  . IR CV LINE INJECTION  08/03/2018  . PORTACATH PLACEMENT Left 06/15/2018   Procedure: INSERTION PORT-A-CATH UNDER ULTRASOUND AND FLUROSCOPIC GUIDANCE;  Surgeon: Michael Boston, MD;  Location: [facility];  Service: General;  Laterality: Left;  . RIGHT COLECTOMY  2015   Dr Carrington Clamp.  Resection of ileocolonic anastomotic stricture.      OB History   No obstetric history on file.      Home Medications    Prior to Admission medications   Medication Sig Start Date End Date Taking? Authorizing Provider  AMBULATORY NON FORMULARY MEDICATION Inject 1,000 mLs into the vein 3 (three) times a week. Medication Name: IV NS with 40 meq Potassium infuse over 3 hours; give 3 times per week for 3 months; port flushes per pharmacy protocol; may flush port on date of order and draw lab work-CBC,CMP,Magnesium; 07/22/18   Jackquline Denmark, MD  clonazePAM (KLONOPIN) 0.5 MG tablet Take 0.5 mg by mouth 2 (two) times daily as needed for anxiety.  03/29/17   [provider]  cyanocobalamin (,VITAMIN B-12,) 1000 MCG/ML injection Inject 1 mL (1,000 mcg total) into the skin every  30 (thirty) days. 03/13/19   Jackquline Denmark, MD  diphenhydrAMINE (BENADRYL) 25 MG tablet Take 25 mg by mouth daily as needed for allergies.    [provider]  Flibanserin (ADDYI) 100 MG TABS Take by mouth.    [provider]  Opium 10 MG/ML (1%) TINC Take 0.6 mLs (6 mg total) by mouth every 6 (six) hours as needed for diarrhea or loose stools. 01/19/19   Jackquline Denmark, MD   pantoprazole (PROTONIX) 40 MG tablet Take 40 mg by mouth daily.    [provider]  promethazine (PHENERGAN) 25 MG tablet Take 1 tablet (25 mg total) by mouth every 8 (eight) hours as needed for nausea or vomiting. 06/20/18   Jackquline Denmark, MD  QNASL 80 MCG/ACT AERS Place 1 spray into the nose as needed (congestion).  06/20/18   [provider]  rosuvastatin (CRESTOR) 20 MG tablet Take 20 mg by mouth daily.    [provider]  SUMAtriptan (IMITREX) 25 MG tablet Take 25 mg by mouth as needed for migraine.     [provider]  traZODone (DESYREL) 100 MG tablet Take 100 mg by mouth at bedtime.     [provider]  Vitamin D, Ergocalciferol, (DRISDOL) 1.25 MG (50000 UT) CAPS capsule Take 50,000 Units by mouth every 7 (seven) days.    [provider]  vortioxetine HBr (TRINTELLIX) 20 MG TABS tablet Take 20 mg by mouth daily.    [provider]    Family History Family History  Adopted: Yes  Family history unknown: Yes    Social History Social History   Tobacco Use  . Smoking status: Current Every Day Smoker    Packs/day: 0.25    Types: Cigarettes  . Smokeless tobacco: Never Used  Substance Use Topics  . Alcohol use: No  . Drug use: No     Allergies   Doxycycline   Review of Systems Review of Systems  Constitutional:       Per HPI, otherwise negative  HENT:       Per HPI, otherwise negative  Respiratory:       Per HPI, otherwise negative  Cardiovascular:       Per HPI, otherwise negative  Gastrointestinal: Negative for vomiting.  Endocrine:       Negative aside from HPI  Genitourinary:       Neg aside from HPI   Musculoskeletal:       Per HPI, otherwise negative  Skin: Negative.   Allergic/Immunologic:       Patient notes Lyme disease in her past  Neurological: Positive for weakness. Negative for syncope.     Physical Exam Updated Vital Signs There were no vitals taken for this visit.  Physical Exam  Vitals signs and nursing note reviewed.  Constitutional:      Appearance: She is ill-appearing.  HENT:     Head: Normocephalic and atraumatic.  Eyes:     Conjunctiva/sclera: Conjunctivae normal.  Cardiovascular:     Rate and Rhythm: Regular rhythm. Tachycardia present.  Pulmonary:     Effort: Pulmonary effort is normal. No respiratory distress.     Breath sounds: Normal breath sounds. No stridor.  Abdominal:     General: There is no distension.     Comments: Soft, nontender abdomen with no guarding.  Left-sided colostomy bag with approximately 200 mL of material.  Skin:    General: Skin is warm and dry.  Neurological:     Mental Status: She is alert and  oriented to person, place, and time.     Cranial Nerves: No cranial nerve deficit.  Psychiatric:        Mood and Affect: Mood is anxious.      ED Treatments / Results  Labs (all labs ordered are listed, but only abnormal results are displayed) Labs Reviewed  CBC WITH DIFFERENTIAL/PLATELET - Abnormal; Notable for the following components:      Result Value   WBC 12.0 (*)    Hemoglobin 11.6 (*)    Neutro Abs 8.1 (*)    All other components within normal limits  COMPREHENSIVE METABOLIC PANEL - Abnormal; Notable for the following components:   Chloride 113 (*)    CO2 20 (*)    Creatinine, Ser 1.12 (*)    Calcium 8.3 (*)    Total Protein 6.3 (*)    Albumin 3.4 (*)    Total Bilirubin 0.1 (*)    GFR calc non Af Amer 59 (*)    All other components within normal limits  LIPASE, BLOOD - Abnormal; Notable for the following components:   Lipase 93 (*)    All other components within normal limits  URINALYSIS, ROUTINE W REFLEX MICROSCOPIC - Abnormal; Notable for the following components:   Leukocytes,Ua SMALL (*)    All other components within normal limits  ETHANOL  ETHANOL  LIPASE, BLOOD    Procedures Procedures (including critical care time)  Medications Ordered in ED Medications  prochlorperazine (COMPAZINE)  injection 10 mg (10 mg Intravenous Given 04/15/19 1740)  metoCLOPramide (REGLAN) injection 10 mg (10 mg Intravenous Given 04/15/19 1914)  ketorolac (TORADOL) 15 MG/ML injection 15 mg (15 mg Intravenous Given 04/15/19 1914)  diphenhydrAMINE (BENADRYL) injection 25 mg (25 mg Intravenous Given 04/15/19 1914)     Initial Impression / Assessment and Plan / ED Course  I have reviewed the triage vital signs and the nursing notes.  Pertinent labs & imaging results that were available during my care of the patient were reviewed by me and considered in my medical decision making (see chart for details).  8:56 PM Patient now substantially better sitting upright, states that her headache is resolved entirely.  No ongoing pain. She is received fluid resuscitation, Compazine, Benadryl, Reglan, Toradol. Patient covered by her cousin who corroborates that she appears much better.  This patient with history of Crohn's disease, complicated by recurrent dehydration presents with bad headache, generalize discomfort, weakness. Patient is awake and alert, though uncomfortable in appearance, but improved substantially here per Findings reassuring, with no ongoing neurologic deficiencies, and resolution of her pain.   Final Clinical Impressions(s) / ED Diagnoses   Final diagnoses:  Dehydration  Bad headache     Carmin Muskrat, MD 04/15/19 2057

## 2019-04-15 NOTE — Discharge Instructions (Signed)
As discussed, your evaluation today has been largely reassuring.  But, it is important that you monitor your condition carefully, and do not hesitate to return to the ED if you develop new, or concerning changes in your condition. ? ?Otherwise, please follow-up with your physician for appropriate ongoing care. ? ?

## 2019-04-15 NOTE — ED Triage Notes (Addendum)
Pt BIB EMS from home. Pt c/o headache and feeling dehydrated. Pt has hx of Crohn's, Lime disease, irregular heartbeat, headaches. Pt states she is supposed to have GI surgery next month. Pt has port. Pt has colostomy bag.   130/90 110 HR  CBG 108 97% RA  20 L foot with 700 mL of NS

## 2019-04-18 ENCOUNTER — Telehealth: Payer: Self-pay | Admitting: Gastroenterology

## 2019-04-18 NOTE — Telephone Encounter (Signed)
Please see previous message

## 2019-04-18 NOTE — Telephone Encounter (Signed)
Pt reported that she has been accepted at the [facility] in [place] for intestinal transplant for full gut rehabilitation surgery.    Pt stated that she will be arriving at the clinic 04/29/19 for first week testing.  Pt would like to thank Dr. Lyndel Safe for all of his efforts to ensure that pt would get there.

## 2019-04-18 NOTE — Telephone Encounter (Signed)
Excellent news Thanks for telling me RG

## 2019-05-04 HISTORY — PX: ESOPHAGOGASTRODUODENOSCOPY: SHX1529

## 2019-05-04 HISTORY — PX: ILEOSCOPY: SHX311

## 2019-05-04 HISTORY — PX: COLONOSCOPY: SHX174

## 2019-05-08 ENCOUNTER — Telehealth: Payer: Self-pay | Admitting: Gastroenterology

## 2019-05-08 DIAGNOSIS — K912 Postsurgical malabsorption, not elsewhere classified: Secondary | ICD-10-CM

## 2019-05-08 DIAGNOSIS — E46 Unspecified protein-calorie malnutrition: Secondary | ICD-10-CM

## 2019-05-08 DIAGNOSIS — R197 Diarrhea, unspecified: Secondary | ICD-10-CM

## 2019-05-08 DIAGNOSIS — K50919 Crohn's disease, unspecified, with unspecified complications: Secondary | ICD-10-CM

## 2019-05-08 NOTE — Telephone Encounter (Signed)
Patient reports she is having mid abdominal/L sided chronic pain/contant bloating/abd swelling/only able to eat small bites at a time then abd swells with excruating pain/ileostomy still with bowel movement, just not as copious as it was before-"thick as oatmeal and painful at the ileostomy site/is still using the opium tincture-one dose yesterday has caused tremendous pain;  Please advise on pain management measures-

## 2019-05-08 NOTE — Telephone Encounter (Signed)
Looks like she had a barium enema Rest of the records have not been updated. Lets try Bentyl 10 mg p.o. 4 times daily for now, half an hour before each meal and bedtime. #120 Should settle down.  If still with problems or if she starts running any fever/chills, will obtain CT Abdo/pelvis. Let us know how she is next week.   RG

## 2019-05-09 MED ORDER — DICYCLOMINE HCL 10 MG PO CAPS: 10.0000 mg | ORAL_CAPSULE | Freq: Three times a day (TID) | ORAL | 0 refills | Status: DC

## 2019-05-09 NOTE — Telephone Encounter (Signed)
Called and spoke with patient-informed patient of RX being sent to pharmacy of patient choice-patient is agreeable with plan of care and requested to change appt to sooner-patient's appt has been changed from 05/17/2019 at 8:30 am to 05/11/2019 at 3:40 pm; patient is aware to bring medical records from [facility] with her so a copy can be made to place in her chart; patient advised to call back should symptoms worsen or  Questions/concerns arise; Patient verbalized understanding of information/instructions;

## 2019-05-11 ENCOUNTER — Ambulatory Visit (INDEPENDENT_AMBULATORY_CARE_PROVIDER_SITE_OTHER): Payer: Managed Care, Other (non HMO) | Admitting: Gastroenterology

## 2019-05-11 ENCOUNTER — Other Ambulatory Visit: Payer: Self-pay

## 2019-05-11 ENCOUNTER — Encounter: Payer: Self-pay | Admitting: Gastroenterology

## 2019-05-11 VITALS — BP 96/68 | HR 92 | Temp 98.0°F | Ht 66.0 in | Wt 161.4 lb

## 2019-05-11 DIAGNOSIS — E46 Unspecified protein-calorie malnutrition: Secondary | ICD-10-CM

## 2019-05-11 DIAGNOSIS — K50919 Crohn's disease, unspecified, with unspecified complications: Secondary | ICD-10-CM

## 2019-05-11 DIAGNOSIS — S8012XA Contusion of left lower leg, initial encounter: Secondary | ICD-10-CM

## 2019-05-11 DIAGNOSIS — R197 Diarrhea, unspecified: Secondary | ICD-10-CM | POA: Diagnosis not present

## 2019-05-11 NOTE — Patient Instructions (Addendum)
If you are age 45 or older, your body mass index should be between 23-30. Your Body mass index is 26.05 kg/m. If this is out of the aforementioned range listed, please consider follow up with your Primary Care Provider.  If you are age 27 or younger, your body mass index should be between 19-25. Your Body mass index is 26.05 kg/m. If this is out of the aformentioned range listed, please consider follow up with your Primary Care Provider.   To help prevent the possible spread of infection to our patients, communities, and staff; we will be implementing the following measures:  As of now we are not allowing any visitors/family members to accompany you to any upcoming appointments with [facility]. If you have any concerns about this please contact our office to discuss prior to the appointment.   It was a pleasure to see you today!  Vito Cirigliano, D.O.

## 2019-05-11 NOTE — Progress Notes (Signed)
P  Chief Complaint:   Crohn's Disease   GI History: 43 year old female with a history of complex Crohn's Disease, high ostomy output, questionable short gut syndrome, anxiety, depression.  She was first diagnosed with Crohn's Disease at age of 11. She had an emergent appendectomy in 1996 for perforated appendix and found to have ileal Crohn's Disease and subsequently had small bowel resection with primary anastomosis. After surgery, she was not on medical therapy and required another bowel resection for SBO in 1999. For her Crohn's, she has been on various medications including mesalamine, AZA, 6- MP. Started on Remicade in 2006 and treated for one year without improvement, so stopped. Started Cimizia in 2014 without clinical improvement. Eventually started Humira in 2015 but had disease progression, which lead to 3rd bowel resection for SBO in 2015. Following surgery, patient continued with clinical symptoms of abd pain and diarrhea. Transitioned to [facility] without benefit and stopped within a year.   For her high ostomy output, she has failed agents include Imodium, Lomotil, and cholesytramine. Symptoms have improved greatly on tincture of opium 1-2 times daily which she started 07/2018. Was seen by Dr. Koleen Distance at [facility] in 08/2018 for evaluation of initiating Gattex, but held off on initiating (wasn't entirely sure that she had short gut syndrome).  In 2017, she was diagnosed with rectal prolapse and diverting sigmoid colostomy was created. Since that time, patient has struggled with high ostomy output and poor PO status. She has been hospitalized multiple times for dehydration and electrolyte abnormalities.  She recently secured extended evaluation at the [facility] for consideration of small bowel transplant.  Was seen by Dr. Alene Mires (transplant surgeon) as a telemedicine appointment in 03/2019.  She was seen by Dr. Bretta Bang (GI).  Colonoscopy/ileoscopy and EGD completed at  [facility] on 9/17 notable for blind and/staple line at 30 cm, mucus, otherwise normal-appearing rectum and sigmoid colon mucosa (biopsies taken).  Colonoscopy via colostomy, advanced to transverse colon to the ileocolonic anastomosis.  Normal colonic mucosa with adherent stool (biopsies taken).  Diffuse severe inflammation characterized by edema, erythema, friability, loss of vascularity, deep ulcerations at the ileocolonic anastomosis and distal terminal ileum.  Active inflammation through the 10 cm of ileum visualized (biopsies c/w active Crohn's ileitis), all consistent with Crohn's ileitis.  EGD normal.  SBFT completed: Contrast reaches level of the colon in just 20 minutes, with estimated small bowel length 190 cm.  Was actually admitted to [facility] due to significant electrolyte derangements during her work up and bowel preps/barium studies. Treated with IVFs. Discharged on 05/05/19.  She reports that she never met with Dr. Alene Mires in person while there.    HPI:     Patient is a 43 y.o. female well-known to the GI clinic with a history of complicated Crohn's Disease presenting to the Gastroenterology Clinic for follow-up and review of her recent evaluation at the [facility].   She played recent VM today from the [facility] verifying biopsies c/w active Crohns noted on recent colonoscopy/ileoscopy.  Available records from [facility] extensively reviewed today with her.  Still with nausea, fatigue, abdominal pain/distension. Single episode of n/v since returning home from [place]. [facility] Still with high output when eating. Slows when fasting. Taking tincture of opium qod now due to constipation, distention, abdominal pain when daily use.  Currently not on any IBD therapy.  She still very much interested in seeking care at an academic  institution for possible small bowel transplant and ongoing Crohn's  evaluation.  Review of systems:     No chest pain, no SOB, no fevers, no urinary sx   Past Medical History:  Diagnosis Date   Anxiety    Colostomy in place [facility])    Crohn's disease ([place])    Herpes    Hypokalemia    Insomnia    Lyme disease    Major depressive disorder    Migraines    Short bowel syndrome    Tachycardia    Vitamin B12 deficiency     Patient's surgical history, family medical history, social history, medications and allergies were all reviewed in Epic    Current Outpatient Medications  Medication Sig Dispense Refill   AMBULATORY NON FORMULARY MEDICATION Inject 1,000 mLs into the vein 3 (three) times a week. Medication Name: IV NS with 40 meq Potassium infuse over 3 hours; give 3 times per week for 3 months; port flushes per pharmacy protocol; may flush port on date of order and draw lab work-CBC,CMP,Magnesium; 36 Dose 0   cyanocobalamin (,VITAMIN B-12,) 1000 MCG/ML injection Inject 1 mL (1,000 mcg total) into the skin every 30 (thirty) days. 1 mL 3   dicyclomine (BENTYL) 10 MG capsule Take 1 capsule (10 mg total) by mouth 4 (four) times daily -  before meals and at bedtime. 120 capsule 0   diphenhydrAMINE (BENADRYL) 25 MG tablet Take 25-50 mg by mouth every 6 (six) hours as needed for allergies.      Opium 10 MG/ML (1%) TINC Take 0.6 mLs (6 mg total) by mouth every 6 (six) hours as needed for diarrhea or loose stools. 1 Bottle 0   pantoprazole (PROTONIX) 40 MG tablet Take 40 mg by mouth daily.     promethazine (PHENERGAN) 25 MG tablet Take 1 tablet (25 mg total) by mouth every 8 (eight) hours as needed for nausea or vomiting. 30 tablet 2   rosuvastatin (CRESTOR) 10 MG tablet Take 10 mg by mouth daily.     SUMAtriptan (IMITREX) 25 MG tablet Take 25 mg by mouth as needed for migraine.      traZODone (DESYREL) 100 MG tablet Take 100 mg by mouth at bedtime.      VASCEPA 1 g CAPS Take 2 capsules by mouth 2 (two) times daily.     Vitamin D,  Ergocalciferol, (DRISDOL) 1.25 MG (50000 UT) CAPS capsule Take 50,000 Units by mouth See admin instructions. Twice weekly     vortioxetine HBr (TRINTELLIX) 20 MG TABS tablet Take 20 mg by mouth daily.     No current facility-administered medications for this visit.     Physical Exam:     BP 96/68    Pulse 92    Temp 98 F (36.7 C)    Ht _0  (1.676 m)    Wt 161 lb 6 oz (73.2 kg)    BMI 26.05 kg/m   GENERAL:  Pleasant female in NAD PSYCH: : Cooperative, normal affect EENT:  conjunctiva pink, mucous membranes moist, neck supple without masses SKIN:  turgor, no lesions seen Musculoskeletal:  Normal muscle tone, normal strength.  Small area of induration in lower LLE proximal to site of recent pedal IV placement with TTP NEURO: Alert and oriented x 3, no focal neurologic deficits   IMPRESSION and PLAN:    1) Crohn's Disease 2) High ostomy output  43 year old female with a history of complex Crohn's Disease, diagnosed in 1996 with ileal resection at that time.  She  has since had 2 additional surgeries, with course complicated by high ostomy output requiring Port-A-Cath placement, periodic IVFs, recurrent admissions for dehydration and electrolyte derangements.  She has trialed multiple medications to include mesalamine, azathioprine, 6-MP, Remicade, Humira, Cimzia, Entyvio.  Additionally with a history of rectal prolapse treated with diverting sigmoid colostomy in 2017.  Has a 10 cm rectal stump, without active disease on recent colonoscopy at [facility].  However, with active Crohn's Disease at the ileocolonic anastomosis and to 10 cm of neo-TI earlier this month in [place].  -Recent colonoscopy at [facility] with active Crohn's disease at the ileocolonic anastomosis and 10 cm of neo-TI. -Has trialed multiple biologic agents in the past.  We discussed ongoing medical management of Crohn's Disease at length today.  Given active disease as above, recommend against starting  immunosuppressive therapy.  Will trial Stelara -Discussed referral to IBD center.  She does not want to return to [place], or [place].  Recent bad experience at [facility] as outlined above, so she does not wish to return there either.  It is unfortunate that she was never fully evaluated by the IBD Clinic while in [place].  She additionally would like to further pursue possible small bowel transplant evaluation.  Did discuss other options, to include [facility]. - Will attempt to reach out to the IBD physicians at [facility] if she wants to entertain simultaneous transplant evaluation there - Continue p.o. intake and fluids as tolerated - Continue periodic IV fluids -Continue tincture of opium as prescribed -Small bowel follow-through completed at [facility].  Contrast reaches the level of the colon in 30 minutes, with small bowel length estimated at 190 cm, which seems less consistent with short gut syndrome -UTD on vaccines - Extensive review of records from [facility] today -Per Dr. Koleen Distance consultation 08/2018, could consider subcutaneous octreotide if ongoing high ostomy output with no specific cause.  No plan for TPN or Gattex.  However, not certain that she has short gut, which seems consistent with her recent SBFT at [facility] as outlined above  3) LLE hematoma: - Small area of induration in anterior LLE, proximal to the site of a recent pedal IV placement.  Does have tenderness with perceived decreased ROM -LLE ultrasound  I spent a total of 40 minutes of face-to-face time with the patient and her husband. Greater than 50% of the time was spent counseling and coordinating care.         Lavena Bullion ,DO, FACG 05/11/2019, 4:03 PM

## 2019-05-12 ENCOUNTER — Ambulatory Visit (HOSPITAL_BASED_OUTPATIENT_CLINIC_OR_DEPARTMENT_OTHER)
Admission: RE | Admit: 2019-05-12 | Discharge: 2019-05-12 | Disposition: A | Discharge status: Home / Self Care | Payer: Managed Care, Other (non HMO) | Source: Ambulatory Visit | Attending: Gastroenterology | Admitting: Gastroenterology

## 2019-05-12 ENCOUNTER — Telehealth: Payer: Self-pay

## 2019-05-12 DIAGNOSIS — K50919 Crohn's disease, unspecified, with unspecified complications: Secondary | ICD-10-CM | POA: Diagnosis present

## 2019-05-12 NOTE — Telephone Encounter (Signed)
Does this patient need to be started on Stelara? If so, specifics for drug regimen please

## 2019-05-12 NOTE — Telephone Encounter (Signed)
-----   Message from Roxine Caddy, [place] sent at 05/11/2019  4:55 PM EDT ----- Patient needs to be approved for Stelara.

## 2019-05-15 NOTE — Telephone Encounter (Signed)
Yes. This is an infusion that needs approval.  Stelara 390 mg IV x1 then 90 mg IV every 8 weeks, starting 8 weeks after the initial induction dose.   We are additionally trying to get her to Dr Milus Height at the [facility]) per request. Can you please assist in this referral for 2nd opinion/evaluation. She would also like to meet with the transplant service re: evaluation for small bowel transplant at [facility] (Dr. Clayborn Heron). Thanks.

## 2019-05-17 ENCOUNTER — Ambulatory Visit: Payer: Managed Care, Other (non HMO) | Admitting: Gastroenterology

## 2019-05-17 NOTE — Telephone Encounter (Signed)
Referrals have been faxed to [facility] per patient request; awaiting Stelara request;

## 2019-05-29 NOTE — Telephone Encounter (Signed)
[facility] [phone] for Dr. Jovita Gamma office to call back to the office to ensure receipt of referral and to obtain a response of appt date/time for the patient;   [facility] [phone] for Dr. Rozell Searing office to call back to the office to ensure receipt of referral and to obtain a response of appt date/time;

## 2019-06-01 NOTE — Telephone Encounter (Signed)
Called to obtain status update on referral appt date/time; patient is currently scheduled to see Dr. Milus Height on 06/07/2019 at 11:30 am; Dr. Shaune Pollack office is to notify the patient of this appt;   Awaiting response from DR. Fishbein's office for appt date/time;

## 2019-06-01 NOTE — Telephone Encounter (Signed)
Request sent to [facility] for Stelara start

## 2019-06-05 NOTE — Telephone Encounter (Signed)
recevied information from [facility]- patient has been scheduled for Stelara infusion on 06/14/2019 at 1:00pm and that office is to contact patient with appt date/time;

## 2019-06-05 NOTE — Telephone Encounter (Signed)
Called and spoke with patient- informed patient that an appt date/time has not been received for [facility] small bowel transplant team -when a response is known the patient will be contacted-Patient verbalized understanding of information/instructions;

## 2019-06-05 NOTE — Telephone Encounter (Signed)
Receive phone call from small bowel transplant coordinator -Ashley-patient will be contacted as soon as approval of appt date/time is known; Caryl Pina will update office if further information is needed for patient care;

## 2019-06-14 ENCOUNTER — Other Ambulatory Visit: Payer: Self-pay

## 2019-06-14 ENCOUNTER — Telehealth: Payer: Self-pay | Admitting: Gastroenterology

## 2019-06-14 NOTE — Progress Notes (Signed)
error 

## 2019-06-14 NOTE — Telephone Encounter (Signed)
Called and spoke with [facility] reports the patient has cancelled and not rescheduled her appt to begin the process for Stelara treatment;  Called and spoke with patient-patient reports that [facility] instructed her to hold off on receiving the Stelara at this time as she is going to have to be admitted for observation at [facility] for clearance for sx;  Patient was advised to call back to [facility] when she is given clearance for Stelara and to also notify Dr. Steve Rattler office of plan of care; Patient verbalized understanding of information/instructions;   Patient advised to call back to the office at [phone] should questions/concerns arise;

## 2019-06-21 ENCOUNTER — Encounter: Payer: Self-pay | Admitting: Gastroenterology

## 2019-06-27 ENCOUNTER — Telehealth: Payer: Self-pay | Admitting: Family Medicine

## 2019-06-27 NOTE — Telephone Encounter (Signed)
Please see previous message and advise

## 2019-06-27 NOTE — Telephone Encounter (Signed)
Please have her call [facility] to coordinate with her schedule They have all her records.  RG

## 2019-06-28 NOTE — Telephone Encounter (Signed)
Called and spoke with patient-patient informed of MD recommendations; patient is agreeable with plan of care and reports she will contact [facility] and inform them of her symptoms; Patient verbalized understanding of information/instructions;  Patient was advised to call the office at [phone] if questions/concerns arise;

## 2019-06-29 NOTE — Telephone Encounter (Signed)
Please review previous message and advise if further action is needed from GI office

## 2019-06-29 NOTE — Telephone Encounter (Signed)
Pt stated that she has contacted [facility] and was instructed by Michail Sermon to go to the ED for a work up.  Pt was informed by Caryl Pina that [facility] "needs proof that pt's issue is something surgical then they can facilitate appointment."

## 2019-06-30 NOTE — Telephone Encounter (Signed)
Lets do this -Please make appointment at IBD clinic in [place].  Please send Dr. Vivia Ewing last note -Thereafter, we will let them determine if she would benefit from small bowel transplant.  Thx  RG

## 2019-07-17 ENCOUNTER — Other Ambulatory Visit: Payer: Self-pay | Admitting: Gastroenterology

## 2019-07-17 DIAGNOSIS — K50919 Crohn's disease, unspecified, with unspecified complications: Secondary | ICD-10-CM

## 2019-07-17 DIAGNOSIS — E46 Unspecified protein-calorie malnutrition: Secondary | ICD-10-CM

## 2019-07-17 DIAGNOSIS — K912 Postsurgical malabsorption, not elsewhere classified: Secondary | ICD-10-CM

## 2019-07-17 DIAGNOSIS — R197 Diarrhea, unspecified: Secondary | ICD-10-CM

## 2019-08-02 ENCOUNTER — Telehealth: Payer: Self-pay | Admitting: Gastroenterology

## 2019-08-02 DIAGNOSIS — E46 Unspecified protein-calorie malnutrition: Secondary | ICD-10-CM

## 2019-08-02 DIAGNOSIS — K912 Postsurgical malabsorption, not elsewhere classified: Secondary | ICD-10-CM

## 2019-08-02 DIAGNOSIS — R197 Diarrhea, unspecified: Secondary | ICD-10-CM

## 2019-08-02 DIAGNOSIS — K50919 Crohn's disease, unspecified, with unspecified complications: Secondary | ICD-10-CM

## 2019-08-02 NOTE — Telephone Encounter (Signed)
Please review and advise.

## 2019-08-03 NOTE — Telephone Encounter (Signed)
Called and spoke with Caryl Pina at [facility] are requesting Dr. Lyndel Safe to write for home health to do dressing changes on the patient's port and lab draws; they are also requesting IV fluids-they are faxing over the results of recently completed lab work for Dr. Lyndel Safe to review; will place this paperwork on MD desk for review;

## 2019-08-03 NOTE — Telephone Encounter (Signed)
Not sure if she truly needs it -if she is taking p.o. Can you get her latest labs? If not done in the last 12 weeks -please check CBC, CMP, CRP, sed rate and magnesium. That will also determine the type and need for fluids. Thx RG

## 2019-08-07 NOTE — Telephone Encounter (Signed)
Caryl Pina called from [facility] to follow up on previous request to review labs that were faxed over. [phone]

## 2019-08-07 NOTE — Telephone Encounter (Signed)
Please see previous message and advise

## 2019-08-08 NOTE — Telephone Encounter (Signed)
Please arrange for home health care for -IVF RL 555m @ 2016mhr 3 times a week. -Dressing changes on the port. -Check CBC, CMP, magnesium in 2 weeks.  We will also arrange further blood work and if any other blood work is needed by [facility]

## 2019-08-09 NOTE — Telephone Encounter (Signed)
Home health orders faxed to [facility] per request;

## 2019-08-09 NOTE — Telephone Encounter (Signed)
Home health amb ref placed per Dr. Steve Rattler recommendations;   Left message for Caryl Pina at [place] to return call to the office;

## 2019-08-24 ENCOUNTER — Telehealth: Payer: Self-pay | Admitting: Gastroenterology

## 2019-08-24 NOTE — Telephone Encounter (Signed)
Patient called into the office- reports she is having difficulty with her ostomy "sticking to my skin-I am having breakdown on my skin from the ostomy wafer-it is bleeding from my stoma and the skin around my ostomy"  "I just wanted an appt with Dr. Lyndel Safe or Dr. Bryan Lemma because I have had unintentional weight gain over the last few days that concerns me-I have gain like 3 #'s in the last day or two"; patient has been scheduled for Dr. Lyndel Safe to see her on 08/31/2019 at 11:40 as a work in;   Patient reports she is going to call [facility] and let them know about all of this and find out what they want her to do;   Patient advised to call back to the office at [phone] should questions/concerns arise;  Patient verbalized understanding of information/instructions;

## 2019-08-29 ENCOUNTER — Other Ambulatory Visit: Payer: Self-pay | Admitting: Gastroenterology

## 2019-08-29 DIAGNOSIS — K912 Postsurgical malabsorption, not elsewhere classified: Secondary | ICD-10-CM

## 2019-08-29 DIAGNOSIS — E46 Unspecified protein-calorie malnutrition: Secondary | ICD-10-CM

## 2019-08-29 DIAGNOSIS — K50919 Crohn's disease, unspecified, with unspecified complications: Secondary | ICD-10-CM

## 2019-08-29 DIAGNOSIS — K90829 Short bowel syndrome, unspecified: Secondary | ICD-10-CM

## 2019-08-29 DIAGNOSIS — R197 Diarrhea, unspecified: Secondary | ICD-10-CM

## 2019-08-31 ENCOUNTER — Encounter: Payer: Self-pay | Admitting: Gastroenterology

## 2019-08-31 ENCOUNTER — Other Ambulatory Visit: Payer: Self-pay

## 2019-08-31 ENCOUNTER — Ambulatory Visit (INDEPENDENT_AMBULATORY_CARE_PROVIDER_SITE_OTHER): Payer: 59 | Admitting: Gastroenterology

## 2019-08-31 ENCOUNTER — Other Ambulatory Visit (INDEPENDENT_AMBULATORY_CARE_PROVIDER_SITE_OTHER): Payer: 59

## 2019-08-31 VITALS — BP 102/76 | HR 105 | Temp 98.2°F | Ht 66.0 in | Wt 179.1 lb

## 2019-08-31 DIAGNOSIS — K912 Postsurgical malabsorption, not elsewhere classified: Secondary | ICD-10-CM

## 2019-08-31 DIAGNOSIS — K50919 Crohn's disease, unspecified, with unspecified complications: Secondary | ICD-10-CM

## 2019-08-31 LAB — COMPREHENSIVE METABOLIC PANEL
ALT: 28 U/L (ref 0–35)
AST: 22 U/L (ref 0–37)
Albumin: 4 g/dL (ref 3.5–5.2)
Alkaline Phosphatase: 149 U/L — ABNORMAL HIGH (ref 39–117)
BUN: 10 mg/dL (ref 6–23)
CO2: 24 mEq/L (ref 19–32)
Calcium: 9.1 mg/dL (ref 8.4–10.5)
Chloride: 107 mEq/L (ref 96–112)
Creatinine, Ser: 1.26 mg/dL — ABNORMAL HIGH (ref 0.40–1.20)
GFR: 45.8 mL/min — ABNORMAL LOW (ref >60.00)
Glucose, Bld: 88 mg/dL (ref 70–99)
Potassium: 4.4 mEq/L (ref 3.5–5.1)
Sodium: 139 mEq/L (ref 135–145)
Total Bilirubin: 0.3 mg/dL (ref 0.2–1.2)
Total Protein: 7.2 g/dL (ref 6.0–8.3)

## 2019-08-31 LAB — CBC WITH DIFFERENTIAL/PLATELET
Basophils Absolute: 0 10*3/uL (ref 0.0–0.1)
Basophils Relative: 0.4 % (ref 0.0–3.0)
Eosinophils Absolute: 0.1 10*3/uL (ref 0.0–0.7)
Eosinophils Relative: 1.1 % (ref 0.0–5.0)
HCT: 38 % (ref 36.0–46.0)
Hemoglobin: 12.3 g/dL (ref 12.0–15.0)
Lymphocytes Relative: 30.9 % (ref 12.0–46.0)
Lymphs Abs: 3.2 10*3/uL (ref 0.7–4.0)
MCHC: 32.4 g/dL (ref 30.0–36.0)
MCV: 85.2 fl (ref 78.0–100.0)
Monocytes Absolute: 0.6 10*3/uL (ref 0.1–1.0)
Monocytes Relative: 5.4 % (ref 3.0–12.0)
Neutro Abs: 6.4 10*3/uL (ref 1.4–7.7)
Neutrophils Relative %: 62.2 % (ref 43.0–77.0)
Platelets: 458 10*3/uL — ABNORMAL HIGH (ref 150.0–400.0)
RBC: 4.45 Mil/uL (ref 3.87–5.11)
RDW: 12.9 % (ref 11.5–15.5)
WBC: 10.3 10*3/uL (ref 4.0–10.5)

## 2019-08-31 LAB — C-REACTIVE PROTEIN: CRP: <1 mg/dL (ref 0.5–20.0)

## 2019-08-31 NOTE — Progress Notes (Addendum)
Chief Complaint: FU   Referring Provider:  [name], NP      ASSESSMENT AND PLAN;   #1.  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 S/P port placement 05/2018.  Being considered for intestinal transplant @ [facility] for intestinal failure. Quit smoking 07/2019.  #2.  Active refractory Crohn's disease (stricturing/fistulizing phenotype) with recurrence at ileocolonic anastomosis and involving 10 cm of distal neo-TI on eval @ [facility] 04/2019.  #3.  Cyclical N/V/abdominal pain.    #4.  Comorbid conditions including anxiety/depression, asymptomatic cholelithiasis.  Plan: - MRE to reassess the extent of Crohn's disease. - Begin Stelara 345m (weight 81kg) x 1 IV induction, then beginning 8 weeks thereafter, 90 mg SQ q8 weekly.. - CBC with diff, CMP, CRP. - Has quit smoking 3 months ago.  I have congratulated [name]. - IVF NS 1 lit/day - [place] [facility] [facility] and stool [place] - Will discuss with Dr SLuevenia [phone] - Joining support group.  Addendum: Stacey Simpson, pl see plan above  HPI:    Stacey WHITSELis a 44y.o. female  [facility] Staring IVF (INS finally approved). Had some bleeding around ostomy which is better now.  Was not mixed with the stool. Continues to empty ostomy bag 3-4 times per day. No fever chills or night sweats. No skin rash. She also had a televisit with Dr. SLuevenia Maxinfew days ago.  I do not find the note in care everywhere yet.   I have reviewed her excellent note from August 03, 2019.  We will follow her plan.   Crohn's history: Dx at age 44  She presented with acute abdomen and underwent emergent appendicectomy in 1996 for perforated gangrenous appendix and also found to have ileal Crohn's disease.  She underwent SBR with primary anastomosis.  After surgery, she was not on medical  therapy and required another SBR for SBO in 1999.  She has been on multiple medications including mesalamine, AZA, 6-MP, Remicade (2006 x 1 yr), Cimzia (2014), Humira (2015) without any benefit.  Had third SBR in 2015 d/t disease progression. Started on entiyo without benefit. In 2017, she had diverting sigmoid colostomy d/t rectal prolapse.  Has been to multiple gastroenterologists at multiple centers.  Multiple admissions.   [place]  [place]/ileoscopy/ EGD 05/04/2019. Nl rectum and sigmoid colon mucosa with staple line at 30 cm (biopsies taken).  Colonoscopy via colostomy, advanced to transverse colon to the ileocolonic anastomosis. Normal colonic mucosa. Diffuse severe inflammation characterized by edema, erythema, friability, loss of vascularity, deep ulcerations at the ileocolonic anastomosis and 10 cm of distal neo-TI. (bx c/w active Crohn's ileitis). EGD normal.  SBFT completed: Contrast reaches level of the colon in just 20 minutes, with estimated small bowel length 190 cm No need for Gattex per Dr. BKoleen Distance She has quit smoking 07/2019.  Filed Weights   08/31/19 1141  Weight: 179 lb 2 oz (81.3 kg)      Past Medical History:  Diagnosis Date  . Anxiety   . Colostomy in place ([facility]   . [place]'s disease ([facility]   . Herpes   . Hypokalemia   . Insomnia   . Lyme disease   . Major depressive disorder   . Migraines   . Short bowel syndrome   . Tachycardia   . Vitamin B12 deficiency     Past  Surgical History:  Procedure Laterality Date  . ABDOMINAL HYSTERECTOMY    . APPENDECTOMY  1990s  . COLONOSCOPY  05/04/2019   [facility], [place]  . COLOSTOMY  2017   Dr Carmell Austria, [place], [place]  . ESOPHAGOGASTRODUODENOSCOPY  05/04/2019   [facility]. [place]  . [place]  . ILEOSCOPY  05/04/2019   [facility], [place]  . IR CV LINE INJECTION  08/03/2018  . PORTACATH PLACEMENT Left 06/15/2018   Procedure: INSERTION PORT-A-CATH UNDER  ULTRASOUND AND FLUROSCOPIC GUIDANCE;  Surgeon: Michael Boston, MD;  Location: [facility];  Service: General;  Laterality: Left;  . RIGHT COLECTOMY  2015   Dr Carrington Clamp.  Resection of ileocolonic anastomotic stricture.     Family History  Adopted: Yes  Family history unknown: Yes    Social History   Tobacco Use  . Smoking status: Former Smoker    Packs/day: 0.25    Types: Cigarettes  . Smokeless tobacco: Never Used  Substance Use Topics  . Alcohol use: No  . Drug use: No    Current Outpatient Medications  Medication Sig Dispense Refill  . cyanocobalamin (,VITAMIN B-12,) 1000 MCG/ML injection Inject 1 mL (1,000 mcg total) into the skin every 30 (thirty) days. 1 mL 3  . diphenhydrAMINE (BENADRYL) 25 MG tablet Take 25-50 mg by mouth every 6 (six) hours as needed for allergies.     . Opium 10 MG/ML (1%) TINC Take 0.6 mLs (6 mg total) by mouth every 6 (six) hours as needed for diarrhea or loose stools. 1 Bottle 0  . pantoprazole (PROTONIX) 40 MG tablet Take 40 mg by mouth daily.    . promethazine (PHENERGAN) 25 MG tablet Take 1 tablet (25 mg total) by mouth every 8 (eight) hours as needed for nausea or vomiting. 30 tablet 2  . rosuvastatin (CRESTOR) 10 MG tablet Take 10 mg by mouth daily.    . SUMAtriptan (IMITREX) 25 MG tablet Take 25 mg by mouth as needed for migraine.     Marland Kitchen VASCEPA 1 g CAPS Take 2 capsules by mouth 2 (two) times daily.    Marland Kitchen vortioxetine HBr (TRINTELLIX) 20 MG TABS tablet Take 20 mg by mouth daily.    . AMBULATORY NON FORMULARY MEDICATION Inject 1,000 mLs into the vein 3 (three) times a week. Medication Name: IV NS with 40 meq Potassium infuse over 3 hours; give 3 times per week for 3 months; port flushes per pharmacy protocol; may flush port on date of order and draw lab work-CBC,CMP,Magnesium; (Patient not taking: Reported on 08/31/2019) 36 Dose 0   No current facility-administered medications for this visit.    Allergies  Allergen Reactions  . Doxycycline Nausea And  Vomiting    Review of Systems:  Neg except HPI     Physical Exam:    BP 102/76   Pulse (!) 105   Temp 98.2 F (36.8 C)   Ht 5' 6"  (1.676 m)   Wt 179 lb 2 oz (81.3 kg)   BMI 28.91 kg/m  Filed Weights   08/31/19 1141  Weight: 179 lb 2 oz (81.3 kg)    Physical Exam  Constitutional: She is oriented to person, place, and time.  HENT:  Head: Normocephalic and atraumatic.  Eyes: Pupils are equal, round, and reactive to light.  Cardiovascular: Normal rate and regular rhythm.  Respiratory: Effort normal and breath sounds normal.  GI: Soft. She exhibits no distension and no mass. There is no abdominal tenderness. There is no  rebound and no guarding.  Ostomy was examined.  Skin around the ostomy looks much better.  No further bleeding.  Minimal inflammation.  No pyoderma.  Has periostomy hernia.  Musculoskeletal:        General: Normal range of motion.     Cervical back: Normal range of motion.  Neurological: She is alert and oriented to person, place, and time.  Skin: Skin is warm.  Psychiatric: She has a normal mood and affect. Her behavior is normal. Judgment and thought content normal.   Examined in presence of patient's husband.     Carmell Austria, MD 09/10/2019, 4:11 PM  Cc: [name], NP

## 2019-08-31 NOTE — Patient Instructions (Signed)
If you are age 46 or older, your body mass index should be between 23-30. Your Body mass index is 28.91 kg/m. If this is out of the aforementioned range listed, please consider follow up with your Primary Care Provider.  If you are age 72 or younger, your body mass index should be between 19-25. Your Body mass index is 28.91 kg/m. If this is out of the aformentioned range listed, please consider follow up with your Primary Care Provider.   Please go to the lab at [facility] ([place] You will need to go to level "B", you do not need an appointment for this. Hours available are 7:30 am - 4:30 pm.   You have been scheduled for an MRI at [facility] on 09/08/19. Your appointment time is 4pm. Please arrive at 3pm minutes prior to your appointment time for registration purposes. Please make certain not to have anything to eat or drink 6 hours prior to your test. In addition, if you have any metal in your body, have a pacemaker or defibrillator, please be sure to let your ordering physician know. This test typically takes 45 minutes to 1 hour to complete. Should you need to reschedule, please call [phone] to do so.  Thank you,  Dr. Jackquline Denmark

## 2019-09-06 ENCOUNTER — Telehealth: Payer: Self-pay | Admitting: Gastroenterology

## 2019-09-06 NOTE — Telephone Encounter (Signed)
Patient calling states that Dr. Lyndel Safe is having her do an MRI on Friday. She states her insurance said they need prior authorization or she will have to pay $1000 out of pocket. She is asking If this can be done.

## 2019-09-07 ENCOUNTER — Other Ambulatory Visit: Payer: Self-pay | Admitting: Gastroenterology

## 2019-09-07 DIAGNOSIS — K912 Postsurgical malabsorption, not elsewhere classified: Secondary | ICD-10-CM

## 2019-09-07 DIAGNOSIS — K50919 Crohn's disease, unspecified, with unspecified complications: Secondary | ICD-10-CM

## 2019-09-07 NOTE — Telephone Encounter (Signed)
Please obtain and updated status on this PA please-if pre cert is denied please call the patient and let her know the reason Thank you Bre

## 2019-09-07 NOTE — Telephone Encounter (Signed)
Called and spoke with patient-RN gave information on previous message;  Patient advised to call back to the office at [phone] should questions/concerns arise;  Patient verbalized understanding of information/instructions;

## 2019-09-07 NOTE — Telephone Encounter (Signed)
Spoke with Alfonzo @ [facility] Patient's plan does not require Prior authorization for any outpatient radiology services. Patient will be subject to 6000 Deductible and then procedure will be covered at 70% NPR REF# [id number]

## 2019-09-08 ENCOUNTER — Ambulatory Visit (HOSPITAL_COMMUNITY)
Admission: RE | Admit: 2019-09-08 | Discharge: 2019-09-08 | Disposition: A | Discharge status: Home / Self Care | Payer: 59 | Source: Ambulatory Visit | Attending: Gastroenterology | Admitting: Gastroenterology

## 2019-09-08 ENCOUNTER — Other Ambulatory Visit: Payer: Self-pay

## 2019-09-08 DIAGNOSIS — K912 Postsurgical malabsorption, not elsewhere classified: Secondary | ICD-10-CM | POA: Diagnosis not present

## 2019-09-08 DIAGNOSIS — K50919 Crohn's disease, unspecified, with unspecified complications: Secondary | ICD-10-CM | POA: Diagnosis present

## 2019-09-08 MED ORDER — HEPARIN SOD (PORK) LOCK FLUSH 100 UNIT/ML IV SOLN
INTRAVENOUS | Status: AC
  Filled 2019-09-08: qty 5

## 2019-09-08 MED ORDER — HEPARIN SOD (PORK) LOCK FLUSH 10 UNIT/ML IV SOLN: 10.0000 [IU] | Freq: Once | INTRAVENOUS | Status: DC

## 2019-09-08 MED ORDER — GADOBUTROL 1 MMOL/ML IV SOLN
8.0000 mL | Freq: Once | INTRAVENOUS | Status: AC | PRN
  Administered 2019-09-08: 8 mL via INTRAVENOUS

## 2019-09-13 ENCOUNTER — Telehealth: Payer: Self-pay | Admitting: Gastroenterology

## 2019-09-13 NOTE — Telephone Encounter (Signed)
Please see result note for additional information

## 2019-09-14 ENCOUNTER — Other Ambulatory Visit: Payer: Self-pay

## 2019-09-14 DIAGNOSIS — K50919 Crohn's disease, unspecified, with unspecified complications: Secondary | ICD-10-CM

## 2019-09-14 DIAGNOSIS — K912 Postsurgical malabsorption, not elsewhere classified: Secondary | ICD-10-CM

## 2019-09-14 MED ORDER — USTEKINUMAB 90 MG/ML ~~LOC~~ SOSY: 90.0000 mg | PREFILLED_SYRINGE | SUBCUTANEOUS | 12 refills | Status: DC

## 2019-09-14 NOTE — Progress Notes (Unsigned)
Stelara RX sent to [facility]-

## 2019-09-20 LAB — PROTIME-INR: INR: 1 (ref 0.9–1.1)

## 2019-09-21 ENCOUNTER — Encounter: Payer: Self-pay | Admitting: Gastroenterology

## 2020-02-06 ENCOUNTER — Telehealth: Payer: Self-pay | Admitting: Gastroenterology

## 2020-02-06 NOTE — Telephone Encounter (Signed)
Per Dr Lyndel Safe, patient needs to get in touch with Dr. Johney Maine. He put it in. I spoke with patient and advised her to call [name]. Pt verbalized understanding.

## 2020-02-13 ENCOUNTER — Other Ambulatory Visit (HOSPITAL_COMMUNITY): Payer: Self-pay | Admitting: Surgery

## 2020-02-13 DIAGNOSIS — Z95828 Presence of other vascular implants and grafts: Secondary | ICD-10-CM

## 2020-02-15 ENCOUNTER — Ambulatory Visit (HOSPITAL_COMMUNITY)
Admission: RE | Admit: 2020-02-15 | Discharge: 2020-02-15 | Disposition: A | Discharge status: Home / Self Care | Payer: 59 | Source: Ambulatory Visit | Attending: Surgery | Admitting: Surgery

## 2020-02-15 ENCOUNTER — Other Ambulatory Visit: Payer: Self-pay

## 2020-02-15 DIAGNOSIS — Z452 Encounter for adjustment and management of vascular access device: Secondary | ICD-10-CM | POA: Diagnosis not present

## 2020-02-15 DIAGNOSIS — Z95828 Presence of other vascular implants and grafts: Secondary | ICD-10-CM

## 2020-02-15 HISTORY — PX: IR CV LINE INJECTION: IMG2294

## 2020-02-15 MED ORDER — HEPARIN SOD (PORK) LOCK FLUSH 100 UNIT/ML IV SOLN
INTRAVENOUS | Status: AC
  Filled 2020-02-15: qty 5

## 2020-02-15 MED ORDER — IOHEXOL 300 MG/ML  SOLN
50.0000 mL | Freq: Once | INTRAMUSCULAR | Status: AC | PRN
  Administered 2020-02-15: 20 mL via INTRAVENOUS

## 2020-02-16 ENCOUNTER — Encounter: Payer: Self-pay | Admitting: Gastroenterology

## 2020-02-23 ENCOUNTER — Other Ambulatory Visit: Payer: Self-pay | Admitting: Radiology

## 2020-02-23 ENCOUNTER — Other Ambulatory Visit (HOSPITAL_COMMUNITY): Payer: Self-pay | Admitting: Surgery

## 2020-02-23 ENCOUNTER — Telehealth: Payer: Self-pay | Admitting: Gastroenterology

## 2020-02-23 DIAGNOSIS — Z95828 Presence of other vascular implants and grafts: Secondary | ICD-10-CM

## 2020-02-23 NOTE — Telephone Encounter (Signed)
LMOM for patient to notify who placed her port and that they can direct her for a referral. She was asked to call for any questions.

## 2020-02-26 ENCOUNTER — Other Ambulatory Visit (HOSPITAL_COMMUNITY): Payer: Self-pay | Admitting: Surgery

## 2020-02-26 ENCOUNTER — Other Ambulatory Visit: Payer: Self-pay

## 2020-02-26 ENCOUNTER — Encounter (HOSPITAL_COMMUNITY): Payer: Self-pay

## 2020-02-26 ENCOUNTER — Other Ambulatory Visit: Payer: Self-pay | Admitting: Radiology

## 2020-02-26 ENCOUNTER — Ambulatory Visit (HOSPITAL_COMMUNITY)
Admission: RE | Admit: 2020-02-26 | Discharge: 2020-02-26 | Disposition: A | Discharge status: Home / Self Care | Payer: 59 | Source: Ambulatory Visit | Attending: Surgery | Admitting: Surgery

## 2020-02-26 DIAGNOSIS — Z95828 Presence of other vascular implants and grafts: Secondary | ICD-10-CM

## 2020-02-26 DIAGNOSIS — K5090 Crohn's disease, unspecified, without complications: Secondary | ICD-10-CM | POA: Diagnosis not present

## 2020-02-26 DIAGNOSIS — F329 Major depressive disorder, single episode, unspecified: Secondary | ICD-10-CM | POA: Diagnosis not present

## 2020-02-26 DIAGNOSIS — Z452 Encounter for adjustment and management of vascular access device: Secondary | ICD-10-CM | POA: Insufficient documentation

## 2020-02-26 DIAGNOSIS — K912 Postsurgical malabsorption, not elsewhere classified: Secondary | ICD-10-CM | POA: Insufficient documentation

## 2020-02-26 DIAGNOSIS — F419 Anxiety disorder, unspecified: Secondary | ICD-10-CM | POA: Diagnosis not present

## 2020-02-26 DIAGNOSIS — Z79899 Other long term (current) drug therapy: Secondary | ICD-10-CM | POA: Diagnosis not present

## 2020-02-26 DIAGNOSIS — Z87891 Personal history of nicotine dependence: Secondary | ICD-10-CM | POA: Diagnosis not present

## 2020-02-26 HISTORY — PX: IR FLUORO GUIDE CV LINE LEFT: IMG2282

## 2020-02-26 HISTORY — PX: IR REMOVAL TUN ACCESS W/ PORT W/O FL MOD SED: IMG2290

## 2020-02-26 HISTORY — PX: IR US GUIDE VASC ACCESS LEFT: IMG2389

## 2020-02-26 LAB — PROTIME-INR
INR: 1 (ref 0.8–1.2)
Prothrombin Time: 12.3 seconds (ref 11.4–15.2)

## 2020-02-26 LAB — CBC
HCT: 39.5 % (ref 36.0–46.0)
Hemoglobin: 12.7 g/dL (ref 12.0–15.0)
MCH: 28.5 pg (ref 26.0–34.0)
MCHC: 32.2 g/dL (ref 30.0–36.0)
MCV: 88.8 fL (ref 80.0–100.0)
Platelets: 448 10*3/uL — ABNORMAL HIGH (ref 150–400)
RBC: 4.45 MIL/uL (ref 3.87–5.11)
RDW: 12.8 % (ref 11.5–15.5)
WBC: 9.6 10*3/uL (ref 4.0–10.5)
nRBC: 0 % (ref 0.0–0.2)

## 2020-02-26 MED ORDER — MIDAZOLAM HCL 2 MG/2ML IJ SOLN
INTRAMUSCULAR | Status: AC
  Filled 2020-02-26: qty 2

## 2020-02-26 MED ORDER — FENTANYL CITRATE (PF) 100 MCG/2ML IJ SOLN
INTRAMUSCULAR | Status: AC | PRN
  Administered 2020-02-26 (×2): 25 ug via INTRAVENOUS
  Administered 2020-02-26: 50 ug via INTRAVENOUS
  Administered 2020-02-26: 25 ug via INTRAVENOUS

## 2020-02-26 MED ORDER — MIDAZOLAM HCL 2 MG/2ML IJ SOLN
INTRAMUSCULAR | Status: AC | PRN
  Administered 2020-02-26: 0.5 mg via INTRAVENOUS
  Administered 2020-02-26: 1 mg via INTRAVENOUS
  Administered 2020-02-26: 0.5 mg via INTRAVENOUS
  Administered 2020-02-26: 1 mg via INTRAVENOUS

## 2020-02-26 MED ORDER — LIDOCAINE-EPINEPHRINE 1 %-1:100000 IJ SOLN
INTRAMUSCULAR | Status: AC | PRN
  Administered 2020-02-26: 20 mL

## 2020-02-26 MED ORDER — CEFAZOLIN SODIUM-DEXTROSE 2-4 GM/100ML-% IV SOLN: 2.0000 g | INTRAVENOUS | Status: AC

## 2020-02-26 MED ORDER — LIDOCAINE-EPINEPHRINE 1 %-1:100000 IJ SOLN
INTRAMUSCULAR | Status: AC
  Filled 2020-02-26: qty 1

## 2020-02-26 MED ORDER — LIDOCAINE HCL 1 % IJ SOLN
INTRAMUSCULAR | Status: AC
  Filled 2020-02-26: qty 20

## 2020-02-26 MED ORDER — CEFAZOLIN SODIUM-DEXTROSE 2-4 GM/100ML-% IV SOLN
INTRAVENOUS | Status: AC
  Administered 2020-02-26: 2 g via INTRAVENOUS
  Filled 2020-02-26: qty 100

## 2020-02-26 MED ORDER — FENTANYL CITRATE (PF) 100 MCG/2ML IJ SOLN
INTRAMUSCULAR | Status: AC
  Filled 2020-02-26: qty 2

## 2020-02-26 NOTE — Discharge Instructions (Addendum)
Implanted Port Removal, Care After This sheet gives you information about how to care for yourself after your procedure. Your health care provider may also give you more specific instructions. If you have problems or questions, contact your health care provider. What can I expect after the procedure? After the procedure, it is common to have:  Soreness or pain near your incision.  Some swelling or bruising near your incision. Follow these instructions at home: Medicines  Take over-the-counter and prescription medicines only as told by your health care provider.  If you were prescribed an antibiotic medicine, take it as told by your health care provider. Do not stop taking the antibiotic even if you start to feel better. Bathing  Do not take baths, swim, or use a hot tub until your health care provider approves. Ask your health care provider if you can take showers. You may only be allowed to take sponge baths. Incision care   Follow instructions from your health care provider about how to take care of your incision. Make sure you: ? Wash your hands with soap and water before you change your bandage (dressing). If soap and water are not available, use hand sanitizer. ? Change your dressing as told by your health care provider. ? Keep your dressing dry. ? Leave stitches (sutures), skin glue, or adhesive strips in place. These skin closures may need to stay in place for 2 weeks or longer. If adhesive strip edges start to loosen and curl up, you may trim the loose edges. Do not remove adhesive strips completely unless your health care provider tells you to do that.  Check your incision area every day for signs of infection. Check for: ? More redness, swelling, or pain. ? More fluid or blood. ? Warmth. ? Pus or a bad smell. Driving   Do not drive for 24 hours if you were given a medicine to help you relax (sedative) during your procedure.  If you did not receive a sedative, ask your  health care provider when it is safe to drive. Activity  Return to your normal activities as told by your health care provider. Ask your health care provider what activities are safe for you.  Do not lift anything that is heavier than 10 lb (4.5 kg), or the limit that you are told, until your health care provider says that it is safe.  Do not do activities that involve lifting your arms over your head. General instructions  Do not use any products that contain nicotine or tobacco, such as cigarettes and e-cigarettes. These can delay healing. If you need help quitting, ask your health care provider.  Keep all follow-up visits as told by your health care provider. This is important. Contact a health care provider if:  You have more redness, swelling, or pain around your incision.  You have more fluid or blood coming from your incision.  Your incision feels warm to the touch.  You have pus or a bad smell coming from your incision.  You have pain that is not relieved by your pain medicine. Get help right away if you have:  A fever or chills.  Chest pain.  Difficulty breathing. Summary  After the procedure, it is common to have pain, soreness, swelling, or bruising near your incision.  If you were prescribed an antibiotic medicine, take it as told by your health care provider. Do not stop taking the antibiotic even if you start to feel better.  Do not drive for 24 hours  if you were given a sedative during your procedure.  Return to your normal activities as told by your health care provider. Ask your health care provider what activities are safe for you. This information is not intended to replace advice given to you by your health care provider. Make sure you discuss any questions you have with your health care provider. Document Revised: 09/16/2017 Document Reviewed: 09/16/2017 Elsevier Patient Education  [place] Insertion, Care After This sheet  gives you information about how to care for yourself after your procedure. Your health care provider may also give you more specific instructions. If you have problems or questions, contact your health care provider. What can I expect after the procedure? After the procedure, it is common to have:  Discomfort at the port insertion site.  Bruising on the skin over the port. This should improve over 3-4 days. Follow these instructions at home: [facility] care  After your port is placed, you will get a manufacturer's information card. The card has information about your port. Keep this card with you at all times.  Take care of the port as told by your health care provider. Ask your health care provider if you or a family member can get training for taking care of the port at home. A home health care nurse may also take care of the port.  Make sure to remember what type of port you have. Incision care      Follow instructions from your health care provider about how to take care of your port insertion site. Make sure you: ? Wash your hands with soap and water before and after you change your bandage (dressing). If soap and water are not available, use hand sanitizer. ? Change your dressing as told by your health care provider. ? Leave stitches (sutures), skin glue, or adhesive strips in place. These skin closures may need to stay in place for 2 weeks or longer. If adhesive strip edges start to loosen and curl up, you may trim the loose edges. Do not remove adhesive strips completely unless your health care provider tells you to do that.  Check your port insertion site every day for signs of infection. Check for: ? Redness, swelling, or pain. ? Fluid or blood. ? Warmth. ? Pus or a bad smell. Activity  Return to your normal activities as told by your health care provider. Ask your health care provider what activities are safe for you.  Do not lift anything that is heavier than 10 lb (4.5 kg),  or the limit that you are told, until your health care provider says that it is safe. General instructions  Take over-the-counter and prescription medicines only as told by your health care provider.  Do not take baths, swim, or use a hot tub until your health care provider approves. Ask your health care provider if you may take showers. You may only be allowed to take sponge baths.  Do not drive for 24 hours if you were given a sedative during your procedure.  Wear a medical alert bracelet in case of an emergency. This will tell any health care providers that you have a port.  Keep all follow-up visits as told by your health care provider. This is important. Contact a health care provider if:  You cannot flush your port with saline as directed, or you cannot draw blood from the port.  You have a fever or chills.  You have redness, swelling, or pain around your  port insertion site.  You have fluid or blood coming from your port insertion site.  Your port insertion site feels warm to the touch.  You have pus or a bad smell coming from the port insertion site. Get help right away if:  You have chest pain or shortness of breath.  You have bleeding from your port that you cannot control. Summary  Take care of the port as told by your health care provider. Keep the manufacturer's information card with you at all times.  Change your dressing as told by your health care provider.  Contact a health care provider if you have a fever or chills or if you have redness, swelling, or pain around your port insertion site.  Keep all follow-up visits as told by your health care provider. This information is not intended to replace advice given to you by your health care provider. Make sure you discuss any questions you have with your health care provider. Document Revised: 03/01/2018 Document Reviewed: 03/01/2018 Elsevier Patient Education  [place]. Moderate Conscious Sedation,  Adult Sedation is the use of medicines to promote relaxation and relieve discomfort and anxiety. Moderate conscious sedation is a type of sedation. Under moderate conscious sedation, you are less alert than normal, but you are still able to respond to instructions, touch, or both. Moderate conscious sedation is used during short medical and dental procedures. It is milder than deep sedation, which is a type of sedation under which you cannot be easily woken up. It is also milder than general anesthesia, which is the use of medicines to make you unconscious. Moderate conscious sedation allows you to return to your regular activities sooner. Tell a health care provider about:  Any allergies you have.  All medicines you are taking, including vitamins, herbs, eye drops, creams, and over-the-counter medicines.  Use of steroids (by mouth or creams).  Any problems you or family members have had with sedatives and anesthetic medicines.  Any blood disorders you have.  Any surgeries you have had.  Any medical conditions you have, such as sleep apnea.  Whether you are pregnant or may be pregnant.  Any use of cigarettes, alcohol, marijuana, or street drugs. What are the risks? Generally, this is a safe procedure. However, problems may occur, including:  Getting too much medicine (oversedation).  Nausea.  Allergic reaction to medicines.  Trouble breathing. If this happens, a breathing tube may be used to help with breathing. It will be removed when you are awake and breathing on your own.  Heart trouble.  Lung trouble. What happens before the procedure? Staying hydrated Follow instructions from your health care provider about hydration, which may include:  Up to 2 hours before the procedure - you may continue to drink clear liquids, such as water, clear fruit juice, black coffee, and plain tea. Eating and drinking restrictions Follow instructions from your health care provider about  eating and drinking, which may include:  8 hours before the procedure - stop eating heavy meals or foods such as meat, fried foods, or fatty foods.  6 hours before the procedure - stop eating light meals or foods, such as toast or cereal.  6 hours before the procedure - stop drinking milk or drinks that contain milk.  2 hours before the procedure - stop drinking clear liquids. Medicine Ask your health care provider about:  Changing or stopping your regular medicines. This is especially important if you are taking diabetes medicines or blood thinners.  Taking medicines  such as aspirin and ibuprofen. These medicines can thin your blood. Do not take these medicines before your procedure if your health care provider instructs you not to.  Tests and exams  You will have a physical exam.  You may have blood tests done to show: ? How well your kidneys and liver are working. ? How well your blood can clot. General instructions  Plan to have someone take you home from the hospital or clinic.  If you will be going home right after the procedure, plan to have someone with you for 24 hours. What happens during the procedure?  An IV tube will be inserted into one of your veins.  Medicine to help you relax (sedative) will be given through the IV tube.  The medical or dental procedure will be performed. What happens after the procedure?  Your blood pressure, heart rate, breathing rate, and blood oxygen level will be monitored often until the medicines you were given have worn off.  Do not drive for 24 hours. This information is not intended to replace advice given to you by your health care provider. Make sure you discuss any questions you have with your health care provider. Document Revised: 07/16/2017 Document Reviewed: 11/23/2015 Elsevier Patient Education  2020 [employer].

## 2020-02-26 NOTE — Procedures (Signed)
Pre Procedural Dx: Poor venous access Post Procedural Dx: Same  Successful [place] and fluoro guided placement of a left IJ tunneled single lumen CVC. Successful removal of right anterior chest wall port-a-cath.  EBL: Trace No immediate post procedural complications.   Ronny Bacon, MD Pager #: [phone]

## 2020-02-26 NOTE — H&P (Signed)
Chief Complaint: Patient was seen in consultation today for [facility] fibrin sheath removal at the request of [place]  Referring Physician(s): Gross,Steven  Supervising Physician: Sandi Mariscal  Patient Status: [facility] - Out-pt  History of Present Illness: Stacey Simpson is a 44 y.o. female   PAC placed 3 yrs ago--- Crohn's disease Short gut syndrome Has done well since then recently notes RN unable to aspirate well-- Worsened over days/week  IR Note 02/15/20: IMPRESSION: Positive for small flap like fibrin sheath or adherent thrombus at the tip of the right IJ approach single-lumen power injectable port catheter. This results in a one-way valve like phenomenon allowing for infusion but prevent Ng aspiration.  Recommend initially scheduling for trans venous catheter stripping in an effort to remove the fibrin sheath. If unsuccessful, formal catheter revision/replacement could be performed at the same setting.   Scheduled now for IR procedure   Past Medical History:  Diagnosis Date  . Anxiety   . Colostomy in place [facility])   . Crohn's disease ([place])   . Herpes   . Hypokalemia   . Insomnia   . Lyme disease   . Major depressive disorder   . Migraines   . Short bowel syndrome   . Tachycardia   . Vitamin B12 deficiency     Past Surgical History:  Procedure Laterality Date  . ABDOMINAL HYSTERECTOMY    . APPENDECTOMY  1990s  . COLONOSCOPY  05/04/2019   [facility], [place]  . COLOSTOMY  2017   Dr Carmell Austria, [place], [place]  . ESOPHAGOGASTRODUODENOSCOPY  05/04/2019   [facility]. [place]  . [place]  . ILEOSCOPY  05/04/2019   [facility], [place]  . IR CV LINE INJECTION  08/03/2018  . IR CV LINE INJECTION  02/15/2020  . PORTACATH PLACEMENT Left 06/15/2018   Procedure: INSERTION PORT-A-CATH UNDER ULTRASOUND AND FLUROSCOPIC GUIDANCE;  Surgeon: Michael Boston, MD;  Location: [facility] ORS;  Service: General;  Laterality: Left;  . RIGHT COLECTOMY   2015   Dr Carrington Clamp.  Resection of ileocolonic anastomotic stricture.     Allergies: Doxycycline  Medications: Prior to Admission medications   Medication Sig Start Date End Date Taking? Authorizing Provider  diphenhydrAMINE (BENADRYL) 25 MG tablet Take 25-50 mg by mouth every 6 (six) hours as needed for allergies.    Yes [provider]  Opium 10 MG/ML (1%) TINC Take 0.6 mLs (6 mg total) by mouth every 6 (six) hours as needed for diarrhea or loose stools. 01/19/19  Yes Jackquline Denmark, MD  promethazine (PHENERGAN) 25 MG tablet Take 1 tablet (25 mg total) by mouth every 8 (eight) hours as needed for nausea or vomiting. 06/20/18  Yes Jackquline Denmark, MD  SUMAtriptan (IMITREX) 25 MG tablet Take 25 mg by mouth as needed for migraine.    Yes [provider]  ustekinumab (STELARA) 90 MG/ML SOSY injection Inject 1 mL (90 mg total) into the skin every 8 (eight) weeks. 09/14/19  Yes Jackquline Denmark, MD  vortioxetine HBr (TRINTELLIX) 20 MG TABS tablet Take 20 mg by mouth daily.   Yes [provider]  AMBULATORY NON FORMULARY MEDICATION Inject 1,000 mLs into the vein 3 (three) times a week. Medication Name: IV NS with 40 meq Potassium infuse over 3 hours; give 3 times per week for 3 months; port flushes per pharmacy protocol; may flush port on date of order and draw lab work-CBC,CMP,Magnesium; Patient not taking: Reported on 08/31/2019 07/22/18   Jackquline Denmark, MD  cyanocobalamin (,VITAMIN B-12,) 1000 MCG/ML injection Inject 1 mL (1,000 mcg total) into the skin every 30 (thirty) days. 03/13/19   Jackquline Denmark, MD  pantoprazole (PROTONIX) 40 MG tablet Take 40 mg by mouth daily.    [provider]  rosuvastatin (CRESTOR) 10 MG tablet Take 10 mg by mouth daily. 03/23/19   [provider]  VASCEPA 1 g CAPS Take 2 capsules by mouth 2 (two) times daily. 04/12/19   [provider]     Family History  Adopted: Yes  Family history unknown: Yes    Social History    Socioeconomic History  . Marital status: Married    Spouse name: Not on file  . Number of children: 2  . Years of education: Not on file  . Highest education level: Not on file  Occupational History  . Occupation: disabled  Tobacco Use  . Smoking status: Former Smoker    Packs/day: 0.25    Types: Cigarettes  . Smokeless tobacco: Never Used  Vaping Use  . Vaping Use: Never used  Substance and Sexual Activity  . Alcohol use: No  . Drug use: No  . Sexual activity: Not on file  Other Topics Concern  . Not on file  Social History Narrative   Married, lives with hsbnd   No pets   1 story, has steps to go inside   Social Determinants of Health   Financial Resource Strain:   . Difficulty of Paying Living Expenses:   Food Insecurity:   . Worried About Charity fundraiser in the Last Year:   . Arboriculturist in the Last Year:   Transportation Needs:   . Film/video editor (Medical):   Marland Kitchen Lack of Transportation (Non-Medical):   Physical Activity:   . Days of Exercise per Week:   . Minutes of Exercise per Session:   Stress:   . Feeling of Stress :   Social Connections:   . Frequency of Communication with Friends and Family:   . Frequency of Social Gatherings with Friends and Family:   . Attends Religious Services:   . Active Member of Clubs or Organizations:   . Attends Archivist Meetings:   Marland Kitchen Marital Status:     Review of Systems: A 12 point ROS discussed and pertinent positives are indicated in the HPI above.  All other systems are negative.  Review of Systems  Constitutional: Negative for activity change, fatigue and fever.  Respiratory: Negative for cough and shortness of breath.   Cardiovascular: Negative for chest pain.  Gastrointestinal: Negative for abdominal pain.  Neurological: Negative for weakness.  Psychiatric/Behavioral: Negative for behavioral problems and confusion.    Vital Signs: BP (!) 137/100   Pulse 90   Temp 97.8 F (36.6  C) (Oral)   Resp 14   Ht 5' 6"  (1.676 m)   Wt 196 lb (88.9 kg)   SpO2 97%   BMI 31.64 kg/m   Physical Exam Vitals reviewed.  Cardiovascular:     Rate and Rhythm: Normal rate and regular rhythm.     Heart sounds: Normal heart sounds.  Pulmonary:     Effort: Pulmonary effort is normal.     Breath sounds: Normal breath sounds.  Abdominal:     Palpations: Abdomen is soft.  Musculoskeletal:        General: Normal range of motion.  Skin:    General: Skin is warm.  Neurological:     Mental Status: She is alert and  oriented to person, place, and time.  Psychiatric:        Behavior: Behavior normal.     Imaging: IR CV Line Injection  Result Date: 02/15/2020 INDICATION: 44 year old female with a surgically placed right IJ approach port catheter. She has experienced difficulty with aspiration and presents for port catheter injection and evaluation. EXAM: CENTRAL VENOUS CATHETER MEDICATIONS: None. ANESTHESIA/SEDATION: None. FLUOROSCOPY TIME:  Fluoroscopy Time: 0 minutes 12 seconds (28 mGy). COMPLICATIONS: None immediate. PROCEDURE: Informed written consent was obtained from the patient after a thorough discussion of the procedural risks, benefits and alternatives. All questions were addressed. A timeout was performed prior to the initiation of the procedure. Initial fluoroscopic evaluation demonstrates a single lumen power injectable port catheter via a right IJ approach. The catheter appears to be intact without evidence of fracture or kinking. Contrast injection was then performed using digital subtraction angiography. No evidence of thrombus within the port reservoir. The port catheter tip is well positioned in the mid superior vena cava. Contrast material exits the tip, but there appears to be a small filling defect adjacent to the catheter tip. The port catheter will not aspirate. Attempts were made at vigorous flushing of the port catheter in an effort to dislodge what appears to be a small  flap like fibrin sheath at the catheter tip. This was unsuccessful. IMPRESSION: Positive for small flap like fibrin sheath or adherent thrombus at the tip of the right IJ approach single-lumen power injectable port catheter. This results in a one-way valve like phenomenon allowing for infusion but prevent Ng aspiration. Recommend initially scheduling for trans venous catheter stripping in an effort to remove the fibrin sheath. If unsuccessful, formal catheter revision/replacement could be performed at the same setting. Signed, Criselda Peaches, MD, [facility] Radiology Electronically Signed   By: Jacqulynn Cadet M.D.   On: 02/15/2020 15:50    Labs:  CBC: Recent Labs    04/15/19 1720 08/31/19 1259  WBC 12.0* 10.3  HGB 11.6* 12.3  HCT 36.0 38.0  PLT 389 458.0*    COAGS: Recent Labs    09/20/19 0000  INR 1.0    BMP: Recent Labs    04/15/19 1720 08/31/19 1259  NA 140 139  K 3.6 4.4  CL 113* 107  CO2 20* 24  GLUCOSE 83 88  BUN 10 10  CALCIUM 8.3* 9.1  CREATININE 1.12* 1.26*  GFRNONAA 59*  --   GFRAA >60  --     LIVER FUNCTION TESTS: Recent Labs    04/15/19 1720 08/31/19 1259  BILITOT 0.1* 0.3  AST 17 22  ALT 15 28  ALKPHOS 86 149*  PROT 6.3* 7.2  ALBUMIN 3.4* 4.0    TUMOR MARKERS: No results for input(s): AFPTM, CEA, CA199, CHROMGRNA in the last 8760 hours.  Assessment and Plan:  Port a cath fibrin sheath removal- possible PAC revision/replacement Pt is aware of procedure benefits and risks including but not limited to Infection; bleeding; vessel damage Agreeable to proceed Consent signed in chart  Thank you for this interesting consult.  I greatly enjoyed meeting Stacey Simpson and look forward to participating in their care.  A copy of this report was sent to the requesting provider on this date.  Electronically Signed: Lavonia Drafts, PA-C 02/26/2020, 9:12 AM   I spent a total of    25  Minutes in face to face in clinical consultation, greater than 50% of which was counseling/coordinating care for [facility] fibrin  sheath removal

## 2020-03-11 ENCOUNTER — Telehealth: Payer: Self-pay | Admitting: Gastroenterology

## 2020-03-22 ENCOUNTER — Other Ambulatory Visit: Payer: Self-pay

## 2020-03-22 NOTE — Telephone Encounter (Signed)
Okay to fill tincture of opium as previous instructions/last note RG

## 2020-03-22 NOTE — Progress Notes (Signed)
Erroneous encounter

## 2020-03-22 NOTE — Telephone Encounter (Signed)
Lets do it on Monday. Even my imprivita does not work May have to do paper prescription  RG

## 2020-03-27 ENCOUNTER — Other Ambulatory Visit: Payer: Self-pay

## 2020-03-27 ENCOUNTER — Other Ambulatory Visit: Payer: Self-pay | Admitting: Gastroenterology

## 2020-03-27 DIAGNOSIS — K912 Postsurgical malabsorption, not elsewhere classified: Secondary | ICD-10-CM

## 2020-03-27 MED ORDER — OPIUM 10 MG/ML (1%) PO TINC: 6.0000 mg | Freq: Four times a day (QID) | ORAL | 0 refills | Status: DC | PRN

## 2020-03-27 NOTE — Progress Notes (Signed)
Erroneous encounter

## 2020-04-01 ENCOUNTER — Encounter: Payer: Self-pay | Admitting: Gastroenterology

## 2020-04-08 ENCOUNTER — Other Ambulatory Visit: Payer: Self-pay

## 2020-04-08 ENCOUNTER — Other Ambulatory Visit: Payer: Self-pay | Admitting: Gastroenterology

## 2020-04-08 DIAGNOSIS — K912 Postsurgical malabsorption, not elsewhere classified: Secondary | ICD-10-CM

## 2020-04-08 MED ORDER — OPIUM 10 MG/ML (1%) PO TINC: 6.0000 mg | Freq: Four times a day (QID) | ORAL | 0 refills | Status: DC | PRN

## 2020-04-09 ENCOUNTER — Telehealth: Payer: Self-pay | Admitting: Gastroenterology

## 2020-04-09 NOTE — Telephone Encounter (Signed)
Pt is returning Trisha's phone call

## 2020-04-09 NOTE — Telephone Encounter (Signed)
I have returned patients phone call. Patient will come by the [facility] office to pick up her prescription.

## 2020-04-19 ENCOUNTER — Telehealth: Payer: Self-pay | Admitting: Gastroenterology

## 2020-04-19 NOTE — Telephone Encounter (Signed)
Dr Bryan Lemma, Would you mind seeing this patient regarding the below note? She saw you once on 05/11/19. Dr Lyndel Safe is already double booked for most of his clinic dates before her appointment on 9/23. Please advise.

## 2020-04-19 NOTE — Telephone Encounter (Signed)
Pt called requesting an appt before 05/06/20 with either Dr. Lyndel Safe or Dr. Loletha Grayer as she stated to have seen both of them. Pt states that we referred her to [facility] for small bowel transplant so she is on a list now and has an appt on 9/23 but they want her to see her GI before that appt. Pls call pt.

## 2020-04-23 ENCOUNTER — Telehealth: Payer: Self-pay | Admitting: Gastroenterology

## 2020-04-23 NOTE — Telephone Encounter (Signed)
Patient was seen once by me, and I have evaluated clinic availability as well this month.  Please look to see if there is any APP availability to see her?  Thank you.

## 2020-04-23 NOTE — Telephone Encounter (Signed)
Appt scheduled w/APP Tye Savoy  04-24-2020

## 2020-04-23 NOTE — Telephone Encounter (Signed)
Nevin Bloodgood, I added this patient on your schedule for tomorrow. Please see below messages. I will contact the Transplant coordinator first thing in the morning to see if there is anything else they need on our part for her up coming visit with [facility]. The patient mentioned blood cultures.

## 2020-04-23 NOTE — Telephone Encounter (Signed)
Tried to call patient. Unable to leave message due to full mailbox. Will wait for patient to call back regarding questions.

## 2020-04-24 ENCOUNTER — Telehealth: Payer: Self-pay | Admitting: Gastroenterology

## 2020-04-24 ENCOUNTER — Ambulatory Visit: Payer: 59 | Admitting: Nurse Practitioner

## 2020-04-24 NOTE — Telephone Encounter (Signed)
Notes faxed over

## 2020-05-14 ENCOUNTER — Telehealth: Payer: Self-pay | Admitting: Gastroenterology

## 2020-05-14 ENCOUNTER — Other Ambulatory Visit (HOSPITAL_COMMUNITY): Payer: Self-pay | Admitting: Surgery

## 2020-05-14 DIAGNOSIS — T829XXA Unspecified complication of cardiac and vascular prosthetic device, implant and graft, initial encounter: Secondary | ICD-10-CM

## 2020-05-14 NOTE — Telephone Encounter (Signed)
Spoke to Stacey Simpson at [facility] to inform her that who ever ordered her central line is the one who needs to order IR to evaluate. This was also discussed with Dr Bryan Lemma who agrees that is the policy. [name] will inform [name] and contact her PCP.

## 2020-05-15 ENCOUNTER — Ambulatory Visit (HOSPITAL_COMMUNITY)
Admission: RE | Admit: 2020-05-15 | Discharge: 2020-05-15 | Disposition: A | Discharge status: Home / Self Care | Payer: 59 | Source: Ambulatory Visit | Attending: Surgery | Admitting: Surgery

## 2020-05-15 ENCOUNTER — Other Ambulatory Visit: Payer: Self-pay

## 2020-05-15 DIAGNOSIS — T829XXA Unspecified complication of cardiac and vascular prosthetic device, implant and graft, initial encounter: Secondary | ICD-10-CM

## 2020-05-16 ENCOUNTER — Other Ambulatory Visit (HOSPITAL_COMMUNITY): Payer: Self-pay | Admitting: Surgery

## 2020-05-16 ENCOUNTER — Ambulatory Visit (HOSPITAL_COMMUNITY)
Admission: RE | Admit: 2020-05-16 | Discharge: 2020-05-16 | Disposition: A | Discharge status: Home / Self Care | Payer: 59 | Source: Ambulatory Visit | Attending: Surgery | Admitting: Surgery

## 2020-05-16 DIAGNOSIS — Z452 Encounter for adjustment and management of vascular access device: Secondary | ICD-10-CM | POA: Diagnosis not present

## 2020-05-16 HISTORY — PX: IR FLUORO GUIDE CV LINE LEFT: IMG2282

## 2020-05-16 MED ORDER — LIDOCAINE HCL 1 % IJ SOLN
INTRAMUSCULAR | Status: DC | PRN
  Administered 2020-05-16: 10 mL via INTRADERMAL
  Administered 2020-05-16: 30 mL via INTRADERMAL

## 2020-05-16 MED ORDER — CHLORHEXIDINE GLUCONATE 4 % EX LIQD
CUTANEOUS | Status: AC
  Filled 2020-05-16: qty 15

## 2020-05-16 MED ORDER — IOHEXOL 300 MG/ML  SOLN
50.0000 mL | Freq: Once | INTRAMUSCULAR | Status: AC | PRN
  Administered 2020-05-16: 20 mL

## 2020-05-16 MED ORDER — HEPARIN SOD (PORK) LOCK FLUSH 100 UNIT/ML IV SOLN
INTRAVENOUS | Status: AC
  Filled 2020-05-16: qty 5

## 2020-05-16 MED ORDER — LIDOCAINE HCL (PF) 1 % IJ SOLN
INTRAMUSCULAR | Status: AC
  Filled 2020-05-16: qty 30

## 2020-05-16 NOTE — Procedures (Signed)
Interventional Radiology Procedure Note  Procedure:   Exchange of left IJ tunneled SL cuffed CVC.   The existing 26cm catheter was not aspirating.    Exchange was made for new 25cm SL cuffed CVC.  First attempt to aspirate failed, so a 42m balloon was passed on an 014 wire through the right atrium and withdrawn in order to retreive the fibrin sheath.   Second attempt to place the 25cm SL cuffed CVC was successful.  Function confirmed.   Complications: None  Recommendations:  - Ok to use - Do not submerge - Routine line care   Signed,  JDulcy Fanny WEarleen Newport DO

## 2020-05-16 NOTE — Progress Notes (Signed)
Interventional Radiology Progress Note  44 yo female with need for ongoing durable venous access  Prior left IJ tunneled cuffed CVC is non-functional.   She has history of fibrin sheath on prior port  We plan to exchange today for new tunneled CVC.   Local anesthesia only.    Signed,  Dulcy Fanny. Earleen Newport, DO

## 2020-05-21 ENCOUNTER — Other Ambulatory Visit (HOSPITAL_COMMUNITY): Payer: Self-pay | Admitting: Emergency Medicine

## 2020-05-21 DIAGNOSIS — T82594A Other mechanical complication of infusion catheter, initial encounter: Secondary | ICD-10-CM

## 2020-05-22 ENCOUNTER — Encounter (HOSPITAL_COMMUNITY): Payer: Self-pay

## 2020-05-22 ENCOUNTER — Ambulatory Visit (HOSPITAL_COMMUNITY)
Admission: RE | Admit: 2020-05-22 | Discharge: 2020-05-22 | Disposition: A | Discharge status: Home / Self Care | Payer: 59 | Source: Ambulatory Visit | Attending: Interventional Radiology | Admitting: Interventional Radiology

## 2020-05-22 ENCOUNTER — Other Ambulatory Visit: Payer: Self-pay

## 2020-05-22 DIAGNOSIS — T82594A Other mechanical complication of infusion catheter, initial encounter: Secondary | ICD-10-CM

## 2020-05-30 NOTE — Telephone Encounter (Signed)
Patient called to follow up on orders

## 2020-05-30 NOTE — Telephone Encounter (Signed)
Spoke to patient to inform her that our office doed not handle her PICC Line. She was advised to contact [name] who place her PICC line. Patient voiced understanding.

## 2020-06-07 HISTORY — PX: SMALL INTESTINE TRANSPLANT: SHX782

## 2021-01-24 ENCOUNTER — Telehealth: Payer: Self-pay | Admitting: Gastroenterology

## 2021-01-24 NOTE — Telephone Encounter (Signed)
Dr. Lyndel Safe please see note below and advise if you want this pt scheduled any sooner, if so where?

## 2021-01-24 NOTE — Telephone Encounter (Signed)
Inbound call from patient stating she is back in town after having her transplant from the referral that we sent over to [facility].  She called to setup an appt with Dr. Lyndel Safe for 02/24/21 but is wanting to know if there's any way she can be seen sooner.  Please advise.

## 2021-01-28 NOTE — Telephone Encounter (Signed)
Can we try to work her into APP clinic please RG

## 2021-01-29 ENCOUNTER — Other Ambulatory Visit: Payer: Self-pay

## 2021-01-29 NOTE — Telephone Encounter (Signed)
Pt scheduled for OV with Alonza Bogus PA 02/03/21@10am . Left detailed message on pts personal voicemail regarding the appt.

## 2021-02-03 ENCOUNTER — Ambulatory Visit: Payer: 59 | Admitting: Gastroenterology

## 2021-02-24 ENCOUNTER — Ambulatory Visit: Payer: 59 | Admitting: Gastroenterology

## 2021-02-28 ENCOUNTER — Ambulatory Visit: Payer: 59 | Admitting: Gastroenterology

## 2021-03-02 ENCOUNTER — Emergency Department (HOSPITAL_COMMUNITY): Payer: 59

## 2021-03-02 ENCOUNTER — Emergency Department (HOSPITAL_COMMUNITY)
Admission: EM | Admit: 2021-03-02 | Discharge: 2021-03-02 | Disposition: A | Discharge status: Home / Self Care | Payer: 59 | Attending: Emergency Medicine | Admitting: Emergency Medicine

## 2021-03-02 ENCOUNTER — Other Ambulatory Visit: Payer: Self-pay

## 2021-03-02 DIAGNOSIS — R0789 Other chest pain: Secondary | ICD-10-CM | POA: Insufficient documentation

## 2021-03-02 DIAGNOSIS — R079 Chest pain, unspecified: Secondary | ICD-10-CM | POA: Diagnosis present

## 2021-03-02 DIAGNOSIS — Z87891 Personal history of nicotine dependence: Secondary | ICD-10-CM | POA: Insufficient documentation

## 2021-03-02 DIAGNOSIS — F419 Anxiety disorder, unspecified: Secondary | ICD-10-CM | POA: Insufficient documentation

## 2021-03-02 LAB — BASIC METABOLIC PANEL
Anion gap: 11 (ref 5–15)
BUN: 13 mg/dL (ref 6–20)
CO2: 21 mmol/L — ABNORMAL LOW (ref 22–32)
Calcium: 8.9 mg/dL (ref 8.9–10.3)
Chloride: 106 mmol/L (ref 98–111)
Creatinine, Ser: 1.22 mg/dL — ABNORMAL HIGH (ref 0.44–1.00)
GFR, Estimated: 55 mL/min — ABNORMAL LOW (ref >60)
Glucose, Bld: 106 mg/dL — ABNORMAL HIGH (ref 70–99)
Potassium: 3.5 mmol/L (ref 3.5–5.1)
Sodium: 138 mmol/L (ref 135–145)

## 2021-03-02 LAB — TROPONIN I (HIGH SENSITIVITY): Troponin I (High Sensitivity): 4 ng/L (ref <18)

## 2021-03-02 LAB — CBC
HCT: 35.4 % — ABNORMAL LOW (ref 36.0–46.0)
Hemoglobin: 11.6 g/dL — ABNORMAL LOW (ref 12.0–15.0)
MCH: 29.4 pg (ref 26.0–34.0)
MCHC: 32.8 g/dL (ref 30.0–36.0)
MCV: 89.8 fL (ref 80.0–100.0)
Platelets: 332 10*3/uL (ref 150–400)
RBC: 3.94 MIL/uL (ref 3.87–5.11)
RDW: 17.7 % — ABNORMAL HIGH (ref 11.5–15.5)
WBC: 7.9 10*3/uL (ref 4.0–10.5)
nRBC: 0 % (ref 0.0–0.2)

## 2021-03-02 LAB — I-STAT BETA HCG BLOOD, ED (MC, WL, AP ONLY): I-stat hCG, quantitative: <5 m[IU]/mL (ref <5)

## 2021-03-02 LAB — CBG MONITORING, ED: Glucose-Capillary: 132 mg/dL — ABNORMAL HIGH (ref 70–99)

## 2021-03-02 MED ORDER — ASPIRIN 81 MG PO CHEW: 324.0000 mg | CHEWABLE_TABLET | Freq: Once | ORAL | Status: DC

## 2021-03-02 NOTE — ED Triage Notes (Signed)
Pt arrives via EMS. C/o central chest pain started at 0900. Denies N/V. Endorses headache.  324 ASA given with relief .   22g Lhand

## 2021-03-02 NOTE — Discharge Instructions (Addendum)
As discussed, today's labs, vital signs, and tests have been reassuring.  Your x-ray does not suggest possible bronchitis, but this may be related to your recent viral illness. Given your description of feeling generally well it is appropriate for close outpatient follow-up.  Please call your physician tomorrow.  Do not hesitate to return here or another emergency department as needed.

## 2021-03-02 NOTE — ED Provider Notes (Signed)
[facility] EMERGENCY DEPARTMENT Provider Note   CSN: [id number] Arrival date & time: 03/02/21  1122     History Chief Complaint  Patient presents with   Chest Pain    Stacey Simpson is a 45 y.o. female.  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 She notes that she recently returned home from [place] where she had a prolonged hospitalization following bowel transplant.  She was seen and evaluated at another local emergency department 4 days ago after episode of nausea, vomiting, diarrhea.  She was admitted 2 days ago, discharged yesterday, per request, due to persistent symptoms.  Today she notes that her bowel symptoms have resolved she has no nausea, no vomiting, no diarrhea.  She has no fever, no chills.  However, today, while walking she had 2 episodes of nonsustained chest tightness.  No obvious precipitant beyond walking, no clearly beating or exacerbating factors and symptoms have improved entirely. Chest pain was brief, tight.  She took her blood pressure and vital signs during these episodes found her blood pressure to be elevated, 150/100.  She reports taking her medication as directed including immunosuppressants. Chart review conducted, notable for evaluation at another hospital with CT scan performed 3 days ago results below: Imaging: CT ABDOMEN PELVIS W CONTRAST (ROUTINE)  Result Date: 02/27/2021 CLINICAL DATA: 45 year old female with concern for anastomotic leakage. EXAM: CT ABDOMEN AND PELVIS WITH CONTRAST TECHNIQUE: Multidetector CT imaging of the abdomen and pelvis was performed using the standard protocol following bolus administration of intravenous contrast. CONTRAST: 100 cc Omnipaque 350 COMPARISON: CT abdomen pelvis dated 02/09/2020. FINDINGS: Lower chest: There are bibasilar linear atelectasis/scarring. No intra-abdominal free air. Small free fluid  within the pelvis. Hepatobiliary: Diffuse fatty liver. No intrahepatic biliary ductal dilatation. Cholecystectomy. No retained calcified stone noted in the central CBD. Pancreas: Unremarkable. No pancreatic ductal dilatation or surrounding inflammatory changes. Spleen: Normal in size without focal abnormality. Adrenals/Urinary Tract: The adrenal glands are unremarkable. There is no hydronephrosis on either side. There is symmetric enhancement and excretion of contrast by both kidneys. The usual and ureters and urinary bladder appear unremarkable. Stomach/Bowel: Postsurgical changes of the bowel with ileocolic anastomosis in the right hemiabdomen. There is diffuse inflammatory changes and edema of the small bowel mesentery with engorgement of the associated vasculature. There is abutment of loops of small bowel as well as transverse colon to the anterior peritoneal wall compatible with adhesions. No evidence of bowel obstruction. Mildly thickened appearance of the colon may be reactive. There is a 4.5 x 3.5 cm loculated appearing fluid collection in the right lower abdomen (59/2) which is suboptimally characterized but likely a seroma. Vascular/Lymphatic: Mild aortoiliac atherosclerotic disease. The IVC is unremarkable. No portal venous gas. There is no adenopathy. Small scattered mesenteric lymph nodes, reactive. Reproductive: Hysterectomy. Other: Midline vertical anterior abdominal wall incisional scar. Musculoskeletal: No acute or significant osseous findings. IMPRESSION: 1. Postsurgical changes of the bowel with ileocolic anastomosis in the right hemiabdomen. No free air or extraluminal contrast to suggest anastomotic leak. 2. Small loculated appearing fluid in the right lower quadrant, likely a seroma. 3. Diffuse edema of the small bowel mesentery with engorgement of the associated vasculature. There is also marked thickening of the transverse colon. Findings may be reactive or represent enterocolitis. No bowel  obstruction. 4. Fatty liver. 5. Aortic Atherosclerosis (ICD10-I70.0). Electronically Signed By: Anner Crete M.D. 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   Colostomy care ([place]) 06/15/2018   Anxiety 06/15/2018   Rheumatoid arthritis ([place]) 06/15/2018   Malabsorption 06/15/2018   Osteopenia 06/15/2018   Hypercholesteremia 06/15/2018   Chronic diarrhea 06/15/2018   Crohn's disease of both small and large intestine with other complication ([place]) 16/05/9603   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/18/2018   Regional enteritis of small intestine with large intestine ([place]) 02/18/2018   Major depression, recurrent ([place]) 07/17/2017   Tobacco use disorder 04/21/2017   Malnutrition ([place]) 03/08/2017   Loss of appetite 03/08/2017   Palpitations 04/30/2016   Headache, chronic daily 10/19/2014   Crohn's disease ([place]) 03/15/2013   Carpal tunnel syndrome, bilateral 12/15/2012    Past Surgical History:  Procedure Laterality Date   ABDOMINAL HYSTERECTOMY     APPENDECTOMY  1990s   COLONOSCOPY  05/04/2019   [facility] [place]  2017   Dr Carmell Austria, [place], [place]   ESOPHAGOGASTRODUODENOSCOPY  05/04/2019   [facility]. [place]   ILEOSCOPY  05/04/2019   [facility], [place]   IR CV LINE INJECTION  08/03/2018   IR CV LINE INJECTION  02/15/2020   IR FLUORO GUIDE CV LINE LEFT  02/26/2020   IR FLUORO GUIDE CV LINE LEFT  05/16/2020   IR REMOVAL TUN ACCESS W/ PORT W/O FL MOD SED  02/26/2020   IR US GUIDE VASC ACCESS LEFT   02/26/2020   PORTACATH PLACEMENT Left 06/15/2018   Procedure: INSERTION PORT-A-CATH UNDER ULTRASOUND AND FLUROSCOPIC GUIDANCE;  Surgeon: Michael Boston, MD;  Location: [facility];  Service: General;  Laterality: Left;   RIGHT COLECTOMY  2015   Dr Carrington Clamp.  Resection of ileocolonic anastomotic stricture.      OB History   No obstetric history on file.     Family History  Adopted: Yes  Family history unknown: Yes    Social History   Tobacco Use   Smoking status: Former    Packs/day: 0.25    Types: Cigarettes   Smokeless tobacco: Never  Vaping Use   Vaping Use: Never used  Substance Use Topics   Alcohol use: No   Drug use: No    Home Medications Prior to Admission medications   Medication Sig Start Date End Date Taking? Authorizing Provider  AMBULATORY NON FORMULARY MEDICATION Inject 1,000 mLs into the vein 3 (three) times a week. Medication Name: IV NS with 40 meq Potassium infuse over 3 hours; give 3 times per week for 3 months; port flushes per pharmacy protocol; may flush port on date of order and draw lab work-CBC,CMP,Magnesium; Patient not taking: Reported on 08/31/2019 07/22/18   Jackquline Denmark, MD  cyanocobalamin (,VITAMIN B-12,) 1000 MCG/ML injection Inject 1 mL (1,000 mcg total) into the skin every 30 (thirty) days. 03/13/19   Jackquline Denmark, MD  diphenhydrAMINE (BENADRYL) 25 MG tablet Take 25-50 mg by mouth every 6 (six) hours as  needed for allergies.     [provider]  Opium 10 MG/ML (1%) TINC Take 0.6 mLs (6 mg total) by mouth every 6 (six) hours as needed for diarrhea or loose stools. 04/08/20   Jackquline Denmark, MD  pantoprazole (PROTONIX) 40 MG tablet Take 40 mg by mouth daily.    [provider]  promethazine (PHENERGAN) 25 MG tablet Take 1 tablet (25 mg total) by mouth every 8 (eight) hours as needed for nausea or vomiting. 06/20/18   Jackquline Denmark, MD  rosuvastatin (CRESTOR) 10 MG tablet Take 10 mg by mouth daily. 03/23/19   [provider]   SUMAtriptan (IMITREX) 25 MG tablet Take 25 mg by mouth as needed for migraine.     [provider]  ustekinumab (STELARA) 90 MG/ML SOSY injection Inject 1 mL (90 mg total) into the skin every 8 (eight) weeks. 09/14/19   Jackquline Denmark, MD  VASCEPA 1 g CAPS Take 2 capsules by mouth 2 (two) times daily. 04/12/19   [provider]  vortioxetine HBr (TRINTELLIX) 20 MG TABS tablet Take 20 mg by mouth daily.    [provider]    Allergies    Doxycycline  Review of Systems   Review of Systems  Constitutional:        Per HPI, otherwise negative  HENT:         Per HPI, otherwise negative  Respiratory:         Per HPI, otherwise negative  Cardiovascular:        Per HPI, otherwise negative  Gastrointestinal:  Positive for abdominal pain, diarrhea, nausea and vomiting.  Endocrine:       Negative aside from HPI  Genitourinary:        Neg aside from HPI   Musculoskeletal:        Per HPI, otherwise negative  Skin: Negative.   Allergic/Immunologic: Positive for immunocompromised state.  Neurological:  Negative for syncope.   Physical Exam Updated Vital Signs BP (!) 122/92   Pulse (!) 101   Temp 98 F (36.7 C)   Resp 19   Ht 5' 6"  (1.676 m)   Wt 74.4 kg   SpO2 95%   BMI 26.47 kg/m   Physical Exam Vitals and nursing note reviewed.  Constitutional:      General: She is not in acute distress.    Appearance: She is well-developed.  HENT:     Head: Normocephalic and atraumatic.  Eyes:     Conjunctiva/sclera: Conjunctivae normal.  Cardiovascular:     Rate and Rhythm: Normal rate and regular rhythm.  Pulmonary:     Effort: Pulmonary effort is normal. No respiratory distress.     Breath sounds: Normal breath sounds. No stridor.  Abdominal:     General: There is no distension.     Comments: Soft, nontender, patient denies any pain.  Skin:    General: Skin is warm and dry.  Neurological:     Mental Status: She is alert and oriented to person, place, and  time.     Cranial Nerves: No cranial nerve deficit.  Psychiatric:        Mood and Affect: Mood is anxious.    ED Results / Procedures / Treatments   Labs (all labs ordered are listed, but only abnormal results are displayed) Labs Reviewed  BASIC METABOLIC PANEL - Abnormal; Notable for the following components:      Result Value   CO2 21 (*)    Glucose, Bld 106 (*)  Creatinine, Ser 1.22 (*)    GFR, Estimated 55 (*)    All other components within normal limits  CBC - Abnormal; Notable for the following components:   Hemoglobin 11.6 (*)    HCT 35.4 (*)    RDW 17.7 (*)    All other components within normal limits  CBG MONITORING, ED - Abnormal; Notable for the following components:   Glucose-Capillary 132 (*)    All other components within normal limits  I-STAT BETA HCG BLOOD, ED ([facility], [facility], [facility] ONLY)  TROPONIN I (HIGH SENSITIVITY)  TROPONIN I (HIGH SENSITIVITY)    EKG EKG Interpretation  Date/Time:  Sunday March 02 2021 11:37:17 EDT Ventricular Rate:  87 PR Interval:  129 QRS Duration: 100 QT Interval:  369 QTC Calculation: 444 R Axis:   83 Text Interpretation: Sinus rhythm RSR' in V1 or V2, right VCD or RVH T wave abnormality Abnormal ECG Confirmed by Carmin Muskrat [phone]) on 03/02/2021 12:05:29 PM  Radiology DG Chest Portable 1 View  Result Date: 03/02/2021 CLINICAL DATA:  45 year old female with history of chest pain. EXAM: PORTABLE CHEST 1 VIEW COMPARISON:  Chest x-ray 06/15/2018. FINDINGS: Areas of peribronchial cuffing are noted in the mid to lower lungs, most evident on the left where there is also some interstitial prominence and patchy ill-defined airspace opacities in the left base. No confluent consolidative airspace disease. No pleural effusions. No pneumothorax. No suspicious appearing pulmonary nodules or masses are noted. No evidence of pulmonary edema. Heart size is normal. Upper mediastinal contours are within normal limits. IMPRESSION: 1. Findings are  concerning for bronchitis with developing bronchopneumonia in the left lung base. Followup PA and lateral chest X-ray is recommended in 3-4 weeks following trial of antibiotic therapy to ensure resolution and exclude underlying malignancy. Electronically Signed   By: Vinnie Langton M.D.   On: 03/02/2021 12:23    Procedures Procedures   Medications Ordered in ED Medications  aspirin chewable tablet 324 mg (has no administration in time range)    ED Course  I have reviewed the triage vital signs and the nursing notes.  Pertinent labs & imaging results that were available during my care of the patient were reviewed by me and considered in my medical decision making (see chart for details).   3:05 PM Patient awake, alert, states that she feels better, has no ongoing complaints, sitting up on the edge of the bed no increased work of breathing, no hypoxia, she denies cough, dyspnea or any complaints. We discussed her x-ray which I reviewed.  She denies any respiratory complaints at all, or chest pain currently.  Some suspicion this may be secondary to her recent illness given her denial of any ongoing concern she states that she feels back to normal, is ready for discharge, wants to follow-up as an outpatient.  Given her reassuring evaluation, this is reasonable, she was encouraged to return for any changes in her condition.  She is accompanied by her husband who is in agreement with plan. MDM Rules/Calculators/A&P MDM Number of Diagnoses or Management Options Atypical chest pain: new, needed workup   Amount and/or Complexity of Data Reviewed Clinical lab tests: ordered and reviewed Tests in the radiology section of CPT: ordered and reviewed Tests in the medicine section of CPT: reviewed and ordered Decide to obtain previous medical records or to obtain history from someone other than the patient: yes Review and summarize past medical records: yes Independent visualization of images,  tracings, or specimens: yes  Risk of Complications,  Morbidity, and/or Mortality Presenting problems: high Diagnostic procedures: high Management options: high  Critical Care Total time providing critical care: < 30 minutes  Patient Progress Patient progress: improved   Final Clinical Impression(s) / ED Diagnoses Final diagnoses:  Atypical chest pain     Carmin Muskrat, MD 03/02/21 1507

## 2021-10-15 ENCOUNTER — Other Ambulatory Visit: Payer: Self-pay | Admitting: Physician Assistant

## 2021-10-15 ENCOUNTER — Other Ambulatory Visit (HOSPITAL_COMMUNITY): Payer: Self-pay | Admitting: Physician Assistant

## 2021-10-15 ENCOUNTER — Ambulatory Visit (HOSPITAL_COMMUNITY)
Admission: RE | Admit: 2021-10-15 | Discharge: 2021-10-15 | Disposition: A | Discharge status: Home / Self Care | Payer: 59 | Source: Ambulatory Visit | Attending: Physician Assistant | Admitting: Physician Assistant

## 2021-10-15 DIAGNOSIS — Z9482 Intestine transplant status: Secondary | ICD-10-CM | POA: Diagnosis present

## 2021-10-15 DIAGNOSIS — R509 Fever, unspecified: Secondary | ICD-10-CM

## 2021-10-15 DIAGNOSIS — D849 Immunodeficiency, unspecified: Secondary | ICD-10-CM | POA: Insufficient documentation

## 2021-10-15 DIAGNOSIS — M549 Dorsalgia, unspecified: Secondary | ICD-10-CM

## 2021-10-15 DIAGNOSIS — R0602 Shortness of breath: Secondary | ICD-10-CM | POA: Diagnosis present

## 2021-10-15 DIAGNOSIS — R109 Unspecified abdominal pain: Secondary | ICD-10-CM

## 2021-10-15 MED ORDER — IOHEXOL 300 MG/ML  SOLN
100.0000 mL | Freq: Once | INTRAMUSCULAR | Status: AC | PRN
  Administered 2021-10-15: 100 mL via INTRAVENOUS

## 2022-02-10 ENCOUNTER — Encounter: Payer: Self-pay | Admitting: Gastroenterology

## 2022-02-10 ENCOUNTER — Ambulatory Visit (INDEPENDENT_AMBULATORY_CARE_PROVIDER_SITE_OTHER): Payer: 59 | Admitting: Gastroenterology

## 2022-02-10 VITALS — BP 160/70 | HR 95 | Ht 66.0 in | Wt 190.0 lb

## 2022-02-10 DIAGNOSIS — K50918 Crohn's disease, unspecified, with other complication: Secondary | ICD-10-CM

## 2022-02-10 DIAGNOSIS — K912 Postsurgical malabsorption, not elsewhere classified: Secondary | ICD-10-CM

## 2022-02-26 ENCOUNTER — Telehealth: Payer: Self-pay

## 2022-02-26 NOTE — Telephone Encounter (Signed)
Spoke with stephanie on 7-11 and she said she will send information to the coordinator and was asked to fax some records to [phone]. Faxed 39 pages total to stephanie and it went through successfully on 02-24-2022

## 2022-02-26 NOTE — Telephone Encounter (Signed)
-----   Message from [place] sent at 02/12/2022  8:47 AM EDT ----- Regarding: referral -Derwood Kaplan Dr Lyndel Safe had me call Colletta [place] at [place] on 02-10-2022 during office visit.  He wants her to have a follow up. Left a voicemail at [phone].  No return communication as of yet. Vivien Rota

## 2022-03-02 ENCOUNTER — Telehealth: Payer: Self-pay | Admitting: Gastroenterology

## 2022-03-02 NOTE — Telephone Encounter (Signed)
Inbound call from patient stating that she spent most of June in the hospital and now has just been released again on Friday afternoon. Patient stated that she has a GI bleed that will not stop and no one can find the cause. Patient stated that she has lost 3,000 cc's of blood. Patient was scheduled for 8/9 at 2:10 for a follow up visit with Dr. Lyndel Safe. Patient is wanting to make him aware and is wanting to see if she can get a sooner appointment. Patient also stated that she is wanting a referral to [facility] so she can have blood transfusions with them. Please advise.

## 2022-03-03 NOTE — Telephone Encounter (Signed)
Pt stated that she has been  Hospitalized three times since June; received 15 units of blood, 3 units of iron, Connected with Hematologist  Dr. Jacqulyn Ducking: Pt stated that she was discharged on Friday and has measured the amount of blood that she has lost since then: Pt stated that she has lost 3000 cc of blood, remains in  Sinus Tachycardia in the 120's, having SOB: Pt was wanting to see Dr. Lyndel Safe and discuss things with him as she has had a Bowel Transplant in the past: Pt states that she is requesting a transfer to [facility] along with a Port instead of the PICC line that she has:  Pt was scheduled for an office visit on 03/05/2022 at 8:30 am to see Dr. Lyndel Safe: Pt made aware  Pt was notified that with the amount of blood loss that she has had recently, along with being symptomatic that she call 911 immediately or have someone take her to the ED immediately: Pt verbalized understanding with all questions answered.

## 2022-03-05 ENCOUNTER — Encounter: Payer: Self-pay | Admitting: Gastroenterology

## 2022-03-05 ENCOUNTER — Ambulatory Visit (INDEPENDENT_AMBULATORY_CARE_PROVIDER_SITE_OTHER): Payer: 59 | Admitting: Gastroenterology

## 2022-03-05 VITALS — BP 120/74 | HR 111 | Ht 66.0 in | Wt 191.0 lb

## 2022-03-05 DIAGNOSIS — K50818 Crohn's disease of both small and large intestine with other complication: Secondary | ICD-10-CM | POA: Diagnosis not present

## 2022-03-05 DIAGNOSIS — K912 Postsurgical malabsorption, not elsewhere classified: Secondary | ICD-10-CM

## 2022-03-05 DIAGNOSIS — R109 Unspecified abdominal pain: Secondary | ICD-10-CM

## 2022-03-05 DIAGNOSIS — D509 Iron deficiency anemia, unspecified: Secondary | ICD-10-CM

## 2022-03-05 DIAGNOSIS — G8929 Other chronic pain: Secondary | ICD-10-CM

## 2022-03-05 MED ORDER — HYDROCHLOROTHIAZIDE 25 MG PO TABS: 25.0000 mg | ORAL_TABLET | Freq: Every day | ORAL | 2 refills | Status: AC

## 2022-03-05 NOTE — Patient Instructions (Signed)
If you are age 48 or older, your body mass index should be between 23-30. Your Body mass index is 30.83 kg/m. If this is out of the aforementioned range listed, please consider follow up with your Primary Care Provider.  If you are age 32 or younger, your body mass index should be between 19-25. Your Body mass index is 30.83 kg/m. If this is out of the aformentioned range listed, please consider follow up with your Primary Care Provider.   ________________________________________________________  The [facility] providers would like to encourage you to use [facility] to communicate with providers for non-urgent requests or questions.  Due to long hold times on the telephone, sending your provider a message by [facility] may be a faster and more efficient way to get a response.  Please allow 48 business hours for a response.  Please remember that this is for non-urgent requests.  _______________________________________________________  Please give fax number to Hematologist for today's notes to be faxed to [facility] [phone]. Hopefully once they get everything the coordinator should be reviewing notes to Dr Angela Adam and then they should call you. If you haven't heard anything in 2 weeks please reach out to [place] [facility].  We have sent the following medications to your pharmacy for you to pick up at your convenience: Hydrochlorothiazide   You have to wait 3 months from your last iron infusion to have the MRI. Please call [phone] to schedule this when you are 3 months from your last iron infusion  You have been scheduled for an MRI at [facility]       on       . Your appointment time is           . Please arrive to admitting (at main entrance of the hospital) 15 minutes prior to your appointment time for registration purposes. Please make certain not to have anything to eat or drink 6 hours prior to your test. In addition, if you have any metal in your body, have a pacemaker or defibrillator,  please be sure to let your ordering physician know. This test typically takes 45 minutes to 1 hour to complete. Should you need to reschedule, please call [phone] to do so.  Thank you,  Dr. Jackquline Denmark

## 2022-03-05 NOTE — Progress Notes (Signed)
Chief Complaint: FU   Referring Provider:  [name], NP      ASSESSMENT AND PLAN;   #1.  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 S/P ileostomy reversal 11/19/20 c/b RLQ abscess at prior ileostomy site (polymicroibal MDROs-VRE, esbl klebsiella, esbl e coli) who was recently hospitalized for CMV colitis (treated with IV Ganciclovir), C.diff (Rxed with Dificid), and norovirus  Re- Adm 4/27- 12/22/2021 with c/f IBD recurrence and incidentally found to have C.Diff. which was treated with 10days of fidaxomicin (EOT 5/8). During her admission she was started on IV Methylprednisolone and Infliximab. She has completed her IV corticosteroid on 5/8 and transitioned to PO prednisone.   #2. IDA on parenteral iron. #3. Immunosuppression D84.9 - Continue Tacrolimus 3 mg PO BID. Goal 4-5 in setting of hx of C. diff and CMV colitis.  - Prednisone 20m PO QD for now until stable Remicade level and [facility] appt  #4. 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  Chronic abdominal pain -Continue methocarbamol 750 mg every 6hrs PRN for abdominal muscle spasms. -She is also on Dilaudid 4-6 mg every 6 hours as needed  #6.  Routine maintenance/screening following transplant (thru PCP) -Dental cleaning Q 6 monthly -Annual physical exam -Annual dermatology appointment -Annual DEXA scan -Annual eye exam -Vaccines: Annual flu vaccine.  No live vaccines.    Plan at this visit:  -Ref to [facility] ([place]. [phone] Pt would like to establish care closer to home. -Remicade (s/p induction, now maintaince) 107mg kg, then Q8weeks -Prednisone 20m5m QD for now until stable Remicade level and [facility] appt -Continue protonix 20mg52mQD -MRE (enterography) -Start  HCTZ 25mg 56mD #30, 2 RF  HPI:    Stacey [id number] y.o23female with H/O migraines, anxiety/depression, CKD, depression, cushingoid features  With stricturing ileo-colonic Crohn's c/b entero-enteric fistula s/p small and large bowel transplantation (06/07/2020, CMV +/+, EBV +/+) with return to OR 06/11/20 for primary closure and revision of loop ileostomy, s/p ileostomy reversal 11/19/20 c/b RLQ abscess at prior ileostomy site (polymicroibal MDROs-VRE, esbl klebsiella, esbl e coli) who was recently hospitalized for CMV colitis (treated with IV Ganciclovir), C.diff, and norovirus.  She was admitted 4/27- 5/8 with c/f IBD recurrence and incidentally found to have C.Diff. which was treated with 10days of fidaxomicin (EOT 5/8). During her admission she was started on IV Methylprednisolone and Infliximab (x 2 doses). She has completed her IV corticosteroid on 5/8 and transitioned to PO prednisone.  Adm WF/HP 7/9-7/14 with LGI bleed s/p 10 U PRBC. Stool + Norovirus. Managed conservatively.  For FU, had bleeding after D/C which has resolved (none since 7/16) Feels better No abdominal pain. Has appt with Dr. Huff ([facility]) this evening.  She will get blood work including CBC there.  They have been very good and will transfuse if needed.  Diarrhea back to baseline 5-6/day, semisolid now. No N/V Has swelling in the legs since discharge.  Previously had been on hydrochlorothiazide.  Would like to restart.  Remicade delieved at home yesterday.  Awaiting nurse for transfusion.  This would be her fourth dose (first maintenance dose)  Stool was neg for c diff  She has been in touch with [facility] and is awaiting appointment.   Past GI work-up/procedures:  Colonoscopy 01/19/2022: -There appeared to be a anastomosis approximately 35  cm. There were numerous ulcers and friability throughout the rest of the colon. There were innumerable areas oozing slightly from the friability. No specific large  volume bleeding site was seen such as a visible vessel.. There was evidence of blood throughout the colon that was washed and suctioned clear. There was sparing of the cecum and colon distal to 35 cm. Numerous biopsies were done throughout the colon of normal appearing mucosa, around the edges of ulcers and in the center of ulcers to rule out Crohn's disease, rejection colitis or viral colitis. The colon was otherwise normal. Internal (grade 1) hemorrhoids; no bleeding was identified Impression The appearance of the colon was most consistent with active Crohn's disease -Bx: Focal chronic inflammation with changes consistent with mucosal erosions and ulcerations.  No CMV.  EGD 01/19/2022: Normal EGD.  CTA 01/16/2022 VASCULAR Aortic atherosclerosis. No active extravasation of contrast within the bowel visualized. NON-VASCULAR Postoperative changes within the bowel. Wall thickening involving the transverse colon and proximal descending colon concerning for infectious or inflammatory colitis. No evidence of bowel obstruction    Transplant anatomy and induction:  - cPRA 0%; Thymoglobulin induction - Inflow via ilial conduit graft to infrarenal aorta, outflow via iliac conduit vein to infrarenal cava, end-end jejuno-jejunostomy, end-end colo-colostomy, with diverting loop ileostomy   CMV/EBV status (D/R): CMV (D+/R+; moderate risk); EBV (D+/R+)   TPN discontinued date: 07/16/2020   Ileostomy reversal on 11/19/20 with Dr. Verneita Griffes.   Rejection History: - None so far.   Positive DSA history: - Positive DSA on 06/24/20 (+DQ7 1:16); treated with IVIG on 11/12 and 06/29/20 - Positive DSA on 07/01/20 [+A1 titer 1:4 (MFI=998), A24 titer 1:16 (MFI=3578), DQ7 titer 1:256 (MFI= 6016)] - Positive DSA on 07/08/20 [A24 1:16 (MFI = 3454), HLA-DQ7 1:256 (MFI = 5222)]; treated with IVIG on 11/24 and 07/11/20 - Positive DSA on 07/16/20 (A24 1:4 (MFI 2071), DQ7 1:256 (MFI: 7059) - Positive DSA on 12/09, 12/16, 12/20,  08/15/20 stable (DQ7 1:64, DP124)  - Positive DSA on 08/26/20 (DQ7 1:16, LS937); treated with 2 doses of IVIG on 1/18 and 09/04/20  - Positive DSA on 09/23/20 and 10/30/20 (DQ7 1:4, DP124); no treatment at this time per Dr. Waymond Cera. - persistent DSA pos DP124 since 11/25/2020 - last DSA 12/22/2021 unchanged.   EBV or CMV viremia history: - EBV was 776 on 09/23/20, so CellCept decreased to 500 mg BID; EBV since then has been negative. - All CMV PCRs have been negative since transplant.   Last DSA and scope: - Last DSA on 12/17/20 was negative. - Last ileoscopy on 10/31/20 was negative for rejection.   Interim PO intake and bowel function/output:    From prev notes: Crohn's history: Dx at age 80.  She presented with acute abdomen and underwent emergent appendicectomy in 1996 for perforated gangrenous appendix and also found to have ileal Crohn's disease.  She underwent SBR with primary anastomosis.  After surgery, she was not on medical therapy and required another SBR for SBO in 1999.  She has been on multiple medications including mesalamine, AZA, 6-MP, Remicade (2006 x 1 yr), Cimzia (2014), Humira (2015) without any benefit.  Had third SBR in 2015 d/t disease progression. Started on entiyo without benefit. In 2017, she had diverting sigmoid colostomy d/t rectal prolapse.  Has been to multiple gastroenterologists at multiple centers.  Multiple admissions.   -Colonoscopy/ileoscopy/ EGD 05/04/2019. Nl rectum and sigmoid colon mucosa with staple line at 30 cm (biopsies taken).  Colonoscopy via colostomy, advanced to transverse colon  to the ileocolonic anastomosis. Normal colonic mucosa. Diffuse severe inflammation characterized by edema, erythema, friability, loss of vascularity, deep ulcerations at the ileocolonic anastomosis and 10 cm of distal neo-TI. (bx c/w active Crohn's ileitis). EGD normal.  SBFT completed: Contrast reaches level of the colon in just 20 minutes, with estimated small bowel length 190  cm No need for Gattex per Dr. Koleen Distance. She has quit smoking 07/2019.  Filed Weights   03/05/22 0838  Weight: 191 lb (86.6 kg)      Past Medical History:  Diagnosis Date   AKI (acute kidney injury) ([place])    Anemia    Anxiety    Bowel dysfunction    Clostridium difficile infection    Colostomy in place ([place])    Coronavirus infection    Crohn's disease ([place])    Depression    Herpes    Hyperglycemia    Hyperkalemia    Hyperlipidemia    Hypokalemia    Insomnia    Intra-abdominal abscess ([facility])    Irregular heart beat    Lyme disease    Major depressive disorder    Migraines    Obesity    Osteopenia    Pneumonia    Pulmonary embolism ([facility])    Short bowel syndrome    Tachycardia    Thrombocytosis    Vancomycin resistant Enterococcus    Vitamin B12 deficiency    Vitamin D deficiency     Past Surgical History:  Procedure Laterality Date   ABDOMINAL HYSTERECTOMY     APPENDECTOMY  1990s   COLONOSCOPY  05/04/2019   [facility] [place]  2017   Dr Carmell Austria, [place], [place]   ESOPHAGOGASTRODUODENOSCOPY  05/04/2019   [facility]   ILEOSCOPY  05/04/2019   [facility], [place]   IR CV LINE INJECTION  08/03/2018   IR CV LINE INJECTION  02/15/2020   IR FLUORO GUIDE CV LINE LEFT  02/26/2020   IR FLUORO GUIDE CV LINE LEFT  05/16/2020   IR REMOVAL TUN ACCESS W/ PORT W/O FL MOD SED  02/26/2020   IR US GUIDE VASC ACCESS LEFT  02/26/2020   PICC LINE INSERTION     PORTACATH PLACEMENT Left 06/15/2018   Procedure: INSERTION PORT-A-CATH UNDER ULTRASOUND AND FLUROSCOPIC GUIDANCE;  Surgeon: Michael Boston, MD;  Location: [facility];  Service: General;  Laterality: Left;   RIGHT COLECTOMY  2015   Dr Carrington Clamp.  Resection of ileocolonic anastomotic stricture.    SMALL INTESTINE TRANSPLANT  06/07/2020    Family History  Adopted: Yes  Family history unknown: Yes    Social History   Tobacco Use   Smoking status: Former     Packs/day: 0.25    Types: Cigarettes   Smokeless tobacco: Never  Vaping Use   Vaping Use: Never used  Substance Use Topics   Alcohol use: No   Drug use: No   Current Outpatient Medications on File Prior to Visit  Medication Sig Dispense Refill   Calcium Carbonate-Simethicone (TUMS GAS RELIEF CHEWY BITES PO) Take by mouth. As needed     Cholecalciferol (REPLESTA) 1.25 MG (50000 UT) WAFR Take by mouth. One wafer every Sunday     clonazePAM (KLONOPIN) 1 MG tablet Take by mouth.     diphenhydrAMINE (BENADRYL) 25 MG tablet Take 25-50 mg by mouth every 6 (six) hours as needed for allergies.      gabapentin (NEURONTIN) 300 MG capsule Take 300 mg by  mouth 2 (two) times daily.     hydrOXYzine (ATARAX) 25 MG tablet Take 25 mg by mouth 3 (three) times daily as needed.     loperamide (IMODIUM) 2 MG capsule Take 2 mg by mouth 4 (four) times daily as needed for diarrhea or loose stools.     loratadine (CLARITIN) 10 MG tablet Take 10 mg by mouth daily.     Magnesium 400 MG TABS Take 1 tablet by mouth 3 (three) times daily.     methocarbamol (ROBAXIN) 750 MG tablet Take 750 mg by mouth every 6 (six) hours as needed for muscle spasms.     metoprolol tartrate (LOPRESSOR) 25 MG tablet Take 25 mg by mouth 2 (two) times daily.     Multiple Vitamin (MULTIVITAMIN) tablet Take 1 tablet by mouth daily.     pantoprazole (PROTONIX) 40 MG tablet Take 40 mg by mouth daily.     predniSONE (DELTASONE) 20 MG tablet Take 40 mg by mouth daily with breakfast.     promethazine (PHENERGAN) 25 MG tablet Take 1 tablet (25 mg total) by mouth every 8 (eight) hours as needed for nausea or vomiting. 30 tablet 2   sertraline (ZOLOFT) 50 MG tablet Take 50 mg by mouth daily.     sulfamethoxazole-trimethoprim (BACTRIM DS) 800-160 MG tablet Take one tablet by mouth Monday, Wednesday and Friday     SUMAtriptan (IMITREX) 25 MG tablet Take 25 mg by mouth as needed for migraine.      tacrolimus (PROGRAF) 1 MG capsule Take 3 mg by  mouth 2 (two) times daily.     traZODone (DESYREL) 100 MG tablet Take 100 mg by mouth at bedtime.     valGANciclovir (VALCYTE) 450 MG tablet Take 900 mg by mouth daily.     naloxone (NARCAN) nasal spray 4 mg/0.1 mL Place 1 spray into the nose once. As needed for opioid reversal (Patient not taking: Reported on 03/05/2022)     No current facility-administered medications on file prior to visit.      Allergies  Allergen Reactions   Doxycycline Nausea And Vomiting    Review of Systems:  Neg except HPI     Physical Exam:    BP 120/74   Pulse (!) 111   Ht _0  (1.676 m)   Wt 191 lb (86.6 kg)   SpO2 99%   BMI 30.83 kg/m  Filed Weights   03/05/22 0838  Weight: 191 lb (86.6 kg)    Gen: awake, alert, NAD HEENT: anicteric, mild pallor, steroid moon face. CV: RRR, no mrg Pulm: CTA b/l Abd: soft, NT/ND, +BS throughout, well-healed surgical scars. Ext: no c/c/e.  Groshong catheter left arm. Neuro: nonfocal    Carmell Austria, MD 03/05/2022, 8:55 AM  Cc: [name], NP

## 2022-03-09 ENCOUNTER — Telehealth: Payer: Self-pay | Admitting: Gastroenterology

## 2022-03-10 NOTE — Telephone Encounter (Signed)
Pt called in stating that she received a call from [facility] in regard to her Remicade infusion requesting home health orders for this pt to have this infusion to be given at home: Pt states that she does not have HomeHealth set up:  Pt stated that she has received the Remicade and its currently in her refrigerator at home: Pt stated that this order was previous written this way from [facility].   Pt scheduled date for her first maintenance dose is for this Friday: Pt stated the reason that she was receiving the infusions at home through her PICC line was due to her Low Immune system and the Dr's trying to keep her from being exposed to the public: Please advise if you would like for me to set this pt up at infusion center or possibly reach out to [facility] who does infusions at home or any other recommendations that you have

## 2022-03-12 NOTE — Telephone Encounter (Signed)
Pl reach out to [facility] who does infusions at home RG

## 2022-03-13 NOTE — Telephone Encounter (Signed)
Spoke with Ronalee Belts at [facility]: Records faxed as requested: Pt made aware: Pt verbalized understanding with all questions answered.

## 2022-03-18 NOTE — Telephone Encounter (Signed)
Left message for pt to call back to check on Status of Infusion Services:

## 2022-03-18 NOTE — Telephone Encounter (Signed)
Patient returned your call, please advise. 

## 2022-03-19 NOTE — Telephone Encounter (Signed)
Spoke with Stacey Simpson about medication that pt currently has in refrigerator at home: Stacey Simpson stated that he will call pt and confirm exactly what she has:

## 2022-03-19 NOTE — Telephone Encounter (Signed)
Ronalee Belts called requested you call him back at [phone].

## 2022-03-19 NOTE — Telephone Encounter (Signed)
Spoke with pt. Pt stated that she has not heard anything from [facility]: Pt was notified that I will reach out to [facility] from there company and check on the Status: Ronalee Belts contacted and questioned the status: Ronalee Belts stated that they are in in the process of getting PA for the infusion from pt insurance company. Pt was made aware of the Status.

## 2022-03-25 ENCOUNTER — Telehealth (INDEPENDENT_AMBULATORY_CARE_PROVIDER_SITE_OTHER): Payer: 59 | Admitting: Gastroenterology

## 2022-03-25 VITALS — Ht 66.0 in | Wt 188.0 lb

## 2022-03-25 DIAGNOSIS — K50918 Crohn's disease, unspecified, with other complication: Secondary | ICD-10-CM

## 2022-03-25 NOTE — Progress Notes (Addendum)
Chief Complaint: FU   Referring Provider:  [name], NP      ASSESSMENT AND PLAN;   #1.  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 S/P ileostomy reversal 11/19/20 c/b RLQ abscess at prior ileostomy site (polymicroibal MDROs-VRE, esbl klebsiella, esbl e coli) who was recently hospitalized for CMV colitis (treated with IV Ganciclovir), C.diff (Rxed with Dificid), and norovirus  Re- Adm 4/27- 12/22/2021 with c/f IBD recurrence and incidentally found to have C.Diff. which was treated with 10days of fidaxomicin (EOT 5/8). During her admission she was started on IV Methylprednisolone and Infliximab. She has completed her IV corticosteroid on 5/8 and transitioned to PO prednisone.   #2. IDA on parenteral iron. #3. Immunosuppression D84.9 - Continue Tacrolimus 3 mg PO BID. Goal 4-5 in setting of hx of C. diff and CMV colitis.  - Prednisone 77m PO QD for now until stable Remicade level and [facility] appt  #4. 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  Chronic abdominal pain -Continue methocarbamol 750 mg every 6hrs PRN for abdominal muscle spasms. -She is also on Dilaudid 4-6 mg every 6 hours as needed  #6.  Routine maintenance/screening following transplant (thru PCP) -Dental cleaning Q 6 monthly -Annual physical exam -Annual dermatology appointment -Annual DEXA scan -Annual eye exam -Vaccines: Annual flu vaccine.  No live vaccines.    Plan at this visit:  -Ref to [facility] ([place] transplant. [phone] Pt would like to establish care closer to home. -Remicade (s/p induction, now maintaince) 1085mg kg, then Q8weeks. SteRemo Lipps get in touch [phone]) Tamika for home health infusion -Prednisone 44m55m QD for now until stable Remicade  level and [facility] appt -Continue protonix 44mg38mQD   I called Stephanie myself.  Left message HPI:    Stacey Simpson 46 y.50 female with H/O migraines, anxiety/depression, CKD, depression, cushingoid features  With stricturing ileo-colonic Crohn's c/b entero-enteric fistula s/p small and large bowel transplantation (06/07/2020, CMV +/+, EBV +/+) with return to OR 06/11/20 for primary closure and revision of loop ileostomy, s/p ileostomy reversal 11/19/20 c/b RLQ abscess at prior ileostomy site (polymicroibal MDROs-VRE, esbl klebsiella, esbl e coli) who was recently hospitalized for CMV colitis (treated with IV Ganciclovir), C.diff, and norovirus.  She was admitted 4/27- 5/8 with c/f IBD recurrence and incidentally found to have C.Diff. which was treated with 10days of fidaxomicin (EOT 5/8). During her admission she was started on IV Methylprednisolone and Infliximab (x 2 doses). She has completed her IV corticosteroid on 5/8 and transitioned to PO prednisone.  Adm WF/HP 7/9-7/14 with LGI bleed s/p 10 U PRBC. Stool + Norovirus. Managed conservatively.  For FU, had bleeding after D/C which has resolved (none since 7/16)  She is getting port placement tomorrow.  Palliative care is coming home as well for consultation.  That would help without any infusions.  She is under excellent care of Dr. [facility]) and had iron transfusion few days ago.  Diarrhea back to baseline 5-6/day, semisolid now. No N/V  Stool was neg for c diff  She has been in touch with [facility] and is awaiting appointment.   Past GI work-up/procedures:  Colonoscopy 01/19/2022: -There appeared to be a anastomosis approximately 35 cm. There were numerous ulcers and friability throughout the rest of the colon. There were innumerable areas oozing slightly from  the friability. No specific large volume bleeding site was seen such as a visible vessel.. There was evidence of blood throughout the colon that was washed and  suctioned clear. There was sparing of the cecum and colon distal to 35 cm. Numerous biopsies were done throughout the colon of normal appearing mucosa, around the edges of ulcers and in the center of ulcers to rule out Crohn's disease, rejection colitis or viral colitis. The colon was otherwise normal. Internal (grade 1) hemorrhoids; no bleeding was identified Impression The appearance of the colon was most consistent with active Crohn's disease -Bx: Focal chronic inflammation with changes consistent with mucosal erosions and ulcerations.  No CMV.  EGD 01/19/2022: Normal EGD.  CTA 01/16/2022 VASCULAR Aortic atherosclerosis. No active extravasation of contrast within the bowel visualized. NON-VASCULAR Postoperative changes within the bowel. Wall thickening involving the transverse colon and proximal descending colon concerning for infectious or inflammatory colitis. No evidence of bowel obstruction    Transplant anatomy and induction:  - cPRA 0%; Thymoglobulin induction - Inflow via ilial conduit graft to infrarenal aorta, outflow via iliac conduit vein to infrarenal cava, end-end jejuno-jejunostomy, end-end colo-colostomy, with diverting loop ileostomy   CMV/EBV status (D/R): CMV (D+/R+; moderate risk); EBV (D+/R+)   TPN discontinued date: 07/16/2020   Ileostomy reversal on 11/19/20 with Dr. Verneita Griffes.   Rejection History: - None so far.   Positive DSA history: - Positive DSA on 06/24/20 (+DQ7 1:16); treated with IVIG on 11/12 and 06/29/20 - Positive DSA on 07/01/20 [+A1 titer 1:4 (MFI=998), A24 titer 1:16 (MFI=3578), DQ7 titer 1:256 (MFI= 6016)] - Positive DSA on 07/08/20 [A24 1:16 (MFI = 3454), HLA-DQ7 1:256 (MFI = 5222)]; treated with IVIG on 11/24 and 07/11/20 - Positive DSA on 07/16/20 (A24 1:4 (MFI 2071), DQ7 1:256 (MFI: 7059) - Positive DSA on 12/09, 12/16, 12/20, 08/15/20 stable (DQ7 1:64, DP124)  - Positive DSA on 08/26/20 (DQ7 1:16, XB284); treated with 2 doses of IVIG on 1/18 and 09/04/20   - Positive DSA on 09/23/20 and 10/30/20 (DQ7 1:4, DP124); no treatment at this time per Dr. Waymond Cera. - persistent DSA pos DP124 since 11/25/2020 - last DSA 12/22/2021 unchanged.   EBV or CMV viremia history: - EBV was 776 on 09/23/20, so CellCept decreased to 500 mg BID; EBV since then has been negative. - All CMV PCRs have been negative since transplant.   Last DSA and scope: - Last DSA on 12/17/20 was negative. - Last ileoscopy on 10/31/20 was negative for rejection.   Interim PO intake and bowel function/output:    From prev notes: Crohn's history: Dx at age 89.  She presented with acute abdomen and underwent emergent appendicectomy in 1996 for perforated gangrenous appendix and also found to have ileal Crohn's disease.  She underwent SBR with primary anastomosis.  After surgery, she was not on medical therapy and required another SBR for SBO in 1999.  She has been on multiple medications including mesalamine, AZA, 6-MP, Remicade (2006 x 1 yr), Cimzia (2014), Humira (2015) without any benefit.  Had third SBR in 2015 d/t disease progression. Started on entiyo without benefit. In 2017, she had diverting sigmoid colostomy d/t rectal prolapse.  Has been to multiple gastroenterologists at multiple centers.  Multiple admissions.   -Colonoscopy/ileoscopy/ EGD 05/04/2019. Nl rectum and sigmoid colon mucosa with staple line at 30 cm (biopsies taken).  Colonoscopy via colostomy, advanced to transverse colon to the ileocolonic anastomosis. Normal colonic mucosa. Diffuse severe inflammation characterized by edema, erythema, friability, loss of vascularity, deep ulcerations  at the ileocolonic anastomosis and 10 cm of distal neo-TI. (bx c/w active Crohn's ileitis). EGD normal.  SBFT completed: Contrast reaches level of the colon in just 20 minutes, with estimated small bowel length 190 cm No need for Gattex per Dr. Koleen Distance. She has quit smoking 07/2019.  There were no vitals filed for this visit.      Past Medical History:  Diagnosis Date   AKI (acute kidney injury) ([place])    Anemia    Anxiety    Bowel dysfunction    Clostridium difficile infection    Colostomy in place ([place])    Coronavirus infection    Crohn's disease ([place])    Depression    Herpes    Hyperglycemia    Hyperkalemia    Hyperlipidemia    Hypokalemia    Insomnia    Intra-abdominal abscess ([facility])    Irregular heart beat    Lyme disease    Major depressive disorder    Migraines    Obesity    Osteopenia    Pneumonia    Pulmonary embolism ([facility])    Short bowel syndrome    Tachycardia    Thrombocytosis    Vancomycin resistant Enterococcus    Vitamin B12 deficiency    Vitamin D deficiency     Past Surgical History:  Procedure Laterality Date   ABDOMINAL HYSTERECTOMY     APPENDECTOMY  1990s   COLONOSCOPY  05/04/2019   [facility] [place]  2017   Dr Carmell Austria, [place], [place]   ESOPHAGOGASTRODUODENOSCOPY  05/04/2019   [facility]. [place]   ILEOSCOPY  05/04/2019   [facility], [place]   IR CV LINE INJECTION  08/03/2018   IR CV LINE INJECTION  02/15/2020   IR FLUORO GUIDE CV LINE LEFT  02/26/2020   IR FLUORO GUIDE CV LINE LEFT  05/16/2020   IR REMOVAL TUN ACCESS W/ PORT W/O FL MOD SED  02/26/2020   IR US GUIDE VASC ACCESS LEFT  02/26/2020   PICC LINE INSERTION     PORTACATH PLACEMENT Left 06/15/2018   Procedure: INSERTION PORT-A-CATH UNDER ULTRASOUND AND FLUROSCOPIC GUIDANCE;  Surgeon: Michael Boston, MD;  Location: [facility];  Service: General;  Laterality: Left;   RIGHT COLECTOMY  2015   Dr Carrington Clamp.  Resection of ileocolonic anastomotic stricture.    SMALL INTESTINE TRANSPLANT  06/07/2020    Family History  Adopted: Yes  Family history unknown: Yes    Social History   Tobacco Use   Smoking status: Former    Packs/day: 0.25    Types: Cigarettes   Smokeless tobacco: Never  Vaping Use   Vaping Use: Never used  Substance Use Topics    Alcohol use: No   Drug use: No   Current Outpatient Medications on File Prior to Visit  Medication Sig Dispense Refill   Calcium Carbonate-Simethicone (TUMS GAS RELIEF CHEWY BITES PO) Take by mouth. As needed     Cholecalciferol (REPLESTA) 1.25 MG (50000 UT) WAFR Take by mouth. One wafer every Sunday     clonazePAM (KLONOPIN) 1 MG tablet Take by mouth.     diphenhydrAMINE (BENADRYL) 25 MG tablet Take 25-50 mg by mouth every 6 (six) hours as needed for allergies.      gabapentin (NEURONTIN) 300 MG capsule Take 300 mg by mouth 2 (two) times daily.     hydrochlorothiazide (HYDRODIURIL) 25 MG tablet Take 1 tablet (25 mg total) by mouth daily. 30 tablet  2   hydrOXYzine (ATARAX) 25 MG tablet Take 25 mg by mouth 3 (three) times daily as needed.     loperamide (IMODIUM) 2 MG capsule Take 2 mg by mouth 4 (four) times daily as needed for diarrhea or loose stools.     loratadine (CLARITIN) 10 MG tablet Take 10 mg by mouth daily.     Magnesium 400 MG TABS Take 1 tablet by mouth 3 (three) times daily.     methocarbamol (ROBAXIN) 750 MG tablet Take 750 mg by mouth every 6 (six) hours as needed for muscle spasms.     metoprolol tartrate (LOPRESSOR) 25 MG tablet Take 25 mg by mouth 2 (two) times daily.     Multiple Vitamin (MULTIVITAMIN) tablet Take 1 tablet by mouth daily.     naloxone (NARCAN) nasal spray 4 mg/0.1 mL Place 1 spray into the nose once. As needed for opioid reversal (Patient not taking: Reported on 03/05/2022)     pantoprazole (PROTONIX) 40 MG tablet Take 40 mg by mouth daily.     predniSONE (DELTASONE) 20 MG tablet Take 40 mg by mouth daily with breakfast.     promethazine (PHENERGAN) 25 MG tablet Take 1 tablet (25 mg total) by mouth every 8 (eight) hours as needed for nausea or vomiting. 30 tablet 2   sertraline (ZOLOFT) 50 MG tablet Take 50 mg by mouth daily.     sulfamethoxazole-trimethoprim (BACTRIM DS) 800-160 MG tablet Take one tablet by mouth Monday, Wednesday and Friday      SUMAtriptan (IMITREX) 25 MG tablet Take 25 mg by mouth as needed for migraine.      tacrolimus (PROGRAF) 1 MG capsule Take 3 mg by mouth 2 (two) times daily.     traZODone (DESYREL) 100 MG tablet Take 100 mg by mouth at bedtime.     valGANciclovir (VALCYTE) 450 MG tablet Take 900 mg by mouth daily.     No current facility-administered medications on file prior to visit.      Allergies  Allergen Reactions   Doxycycline Nausea And Vomiting    Review of Systems:  Neg except HPI     Physical Exam:    Not examined since this was a televisit  I connected with  Ashok Norris on 03/25/22 by a video enabled telemedicine application and verified that I am speaking with the correct person using two identifiers.   I discussed the limitations of evaluation and management by telemedicine. The patient expressed understanding and agreed to proceed.  Patient at home Physician in the office   Carmell Austria, MD 03/25/2022, 2:21 PM  Cc: [name], NP

## 2022-03-25 NOTE — Patient Instructions (Addendum)
_______________________________________________________  If you are age 48 or older, your body mass index should be between 23-30. Your There is no height or weight on file to calculate BMI. If this is out of the aforementioned range listed, please consider follow up with your Primary Care Provider.  If you are age 47 or younger, your body mass index should be between 19-25. Your There is no height or weight on file to calculate BMI. If this is out of the aformentioned range listed, please consider follow up with your Primary Care Provider.   ________________________________________________________  The [facility] providers would like to encourage you to use [facility] to communicate with providers for non-urgent requests or questions.  Due to long hold times on the telephone, sending your provider a message by [facility] may be a faster and more efficient way to get a response.  Please allow 48 business hours for a response.  Please remember that this is for non-urgent requests.  _______________________________________________________  Please call with any questions or concerns.  Thank you,  Dr. Jackquline Denmark

## 2022-03-27 NOTE — Telephone Encounter (Signed)
A peer to peer was scheduled with Dr. Lyndel Safe on Monday 03/30/2022 at 12:30: They stated that the will call Dr. Lyndel Safe: Personal number provided:  If this time or date does not work with your schedule please advise and I can change the date and time to works best:  Please advise

## 2022-03-27 NOTE — Telephone Encounter (Signed)
Spoke to [place] from [employer]: Ronalee Belts stated that the PA for Remicade was denied from pt insurance: Ronalee Belts stated that Dr. Lyndel Safe needs to call pt's insurance company before the 19th of the August for the appeal: [phone]   Reference Number    #[id number]

## 2022-03-30 NOTE — Telephone Encounter (Signed)
Received phone call from Gwyneth Revels NP at [facility] stating that Pt Remicade has been approved:  PA # [id number]: Gwyneth Revels NP stated that Dr. Lyndel Safe Peer to Peer has been canceled and is no longer needed: Ronalee Belts from [employer] made aware:  Pt made aware:

## 2022-04-02 NOTE — Telephone Encounter (Signed)
Pt called in and stated that [facility] stated that they will come to her house tomorrow at 9:00 AM to infuse Remicade:Pt expressed her thankfulness:

## 2022-04-03 NOTE — Telephone Encounter (Signed)
Left message for Col[place]ta [place] at [facility] to call back: Direct number provided:

## 2022-04-09 NOTE — Telephone Encounter (Signed)
Contacted pt. Pt stated that Stacey Simpson is her Point of Contact at [facility] Transplant Team now: Number provided [phone]:  Left message for Stacey Simpson [phone])  at [facility] Transplant team to call back: Direct number provided

## 2022-04-10 ENCOUNTER — Telehealth: Payer: Self-pay | Admitting: Gastroenterology

## 2022-04-10 NOTE — Telephone Encounter (Signed)
Patient call, was wondering if an infusion order can be call in for her. Requesting a call back as soon as possible. Please call to advise.

## 2022-04-10 NOTE — Telephone Encounter (Signed)
Pt states that she did not receive her Remicade when the nurse came to her house due to the fact that she was not premedicated: Pt is requesting an order for IV Benadryl along with oral Tylenol to be given prior to infusion:  Please advise:

## 2022-04-10 NOTE — Telephone Encounter (Signed)
Spoke to  Almyra Free [phone])  at [facility] Transplant team in regard to pt. Almyra Free stated that she has received the 140 pages that were Faxed from [facility] did state that she was going to call the pt and discuss things with her. Almyra Free stated that [facility] stated that they still wanted to follow up with her care and had a care plan for her: Almyra Free to discuss things with pt next week:

## 2022-04-12 NOTE — Telephone Encounter (Signed)
All the records are in care everywhere. Patient has made it clear that she wanted to follow-up closer to home. Can you please confirm with her. RG

## 2022-04-12 NOTE — Telephone Encounter (Signed)
Please go ahead. She can be premedicated with IV Benadryl 50 mg IV. Also get Tylenol 650 mg p.o. x1 before Remicade. RG

## 2022-04-13 NOTE — Telephone Encounter (Signed)
Pt has stated multiple times that she is pursing transfer of care to [facility]: Stacey Simpson [phone])  at [facility] Transplant team stated that she is going to contact pt to discuss transfer:

## 2022-04-13 NOTE — Telephone Encounter (Signed)
Stacey Simpson from [employer] contacted and was transferred to speak with pharmacist: Spoke with Len and verbal order was given to Len for pt to be premedicated with IV Benadryl 50 mg IV. Also get Tylenol 650 mg p.o. x1 before Remicade. Verbal Order read back Len  verbalized understanding with all questions answered.

## 2022-10-13 IMAGING — CT CT CHEST-ABD-PELV W/ CM
3 of 5 series · 13 of 36 positions shown, 15 images · IV contrast (Omni 300)
Comparison: 02/27/2021

CLINICAL DATA: History of bowel transplant.  Abdominal pain.

EXAM:
CT CHEST, ABDOMEN, AND PELVIS WITH CONTRAST
TECHNIQUE: Multidetector CT imaging of the chest, abdomen and pelvis was
performed following the standard protocol during bolus
administration of intravenous contrast.

[Series 3: cap with 5mm st · axial · 0.83mm/px · z∈[+773,+1323]mm · 9 of 138 slices shown, 11 images]
[im 14/138  mediastinal]
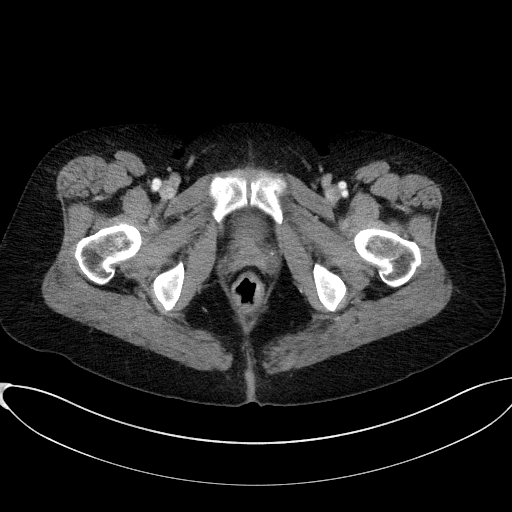
[im 14/138  bone]
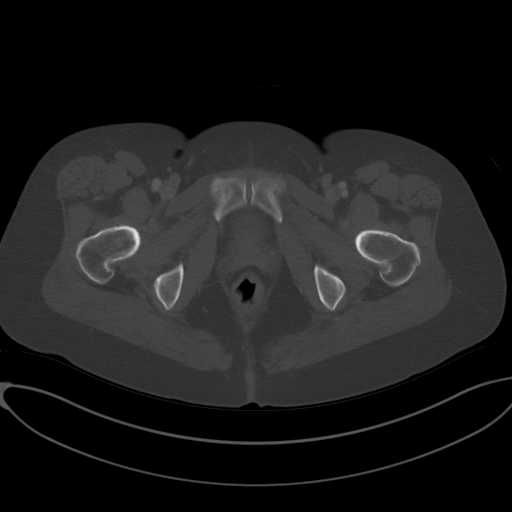
[im 28/138  mediastinal]
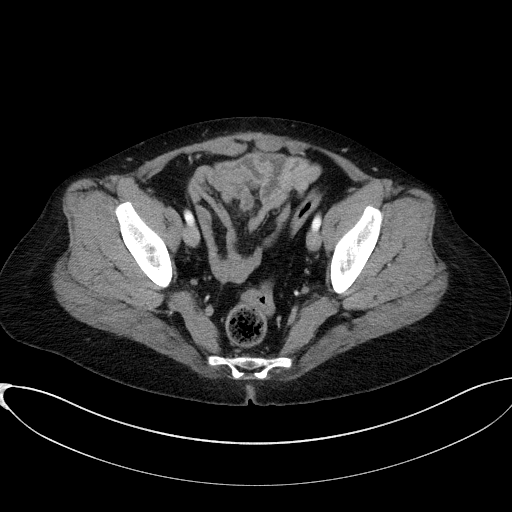
[im 42/138  mediastinal]
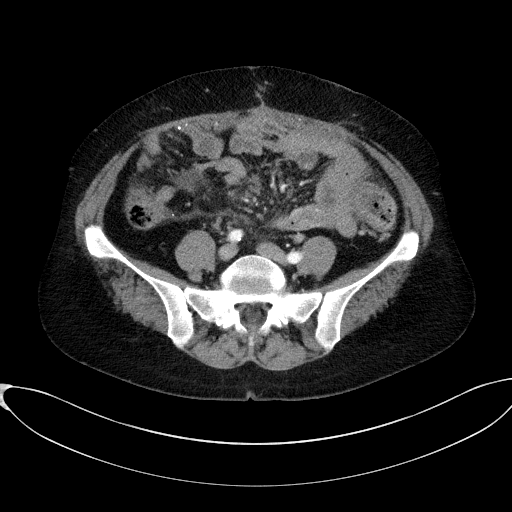
[im 55/138  mediastinal]
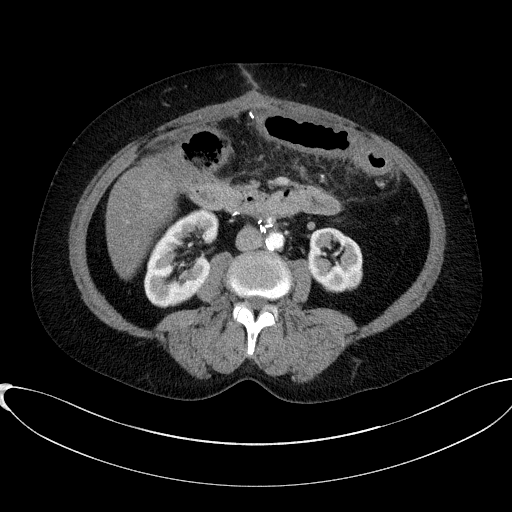
[im 69/138  mediastinal]
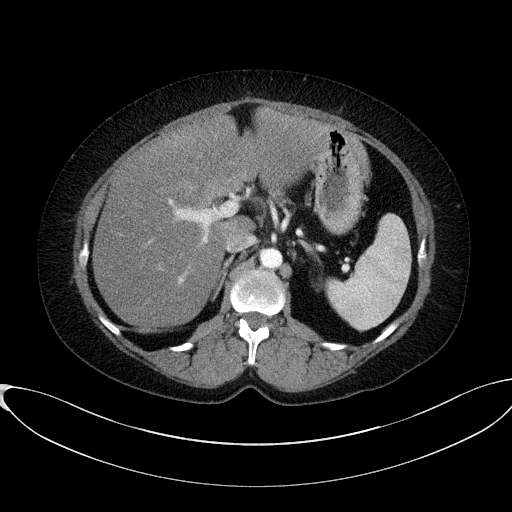
[im 83/138  mediastinal]
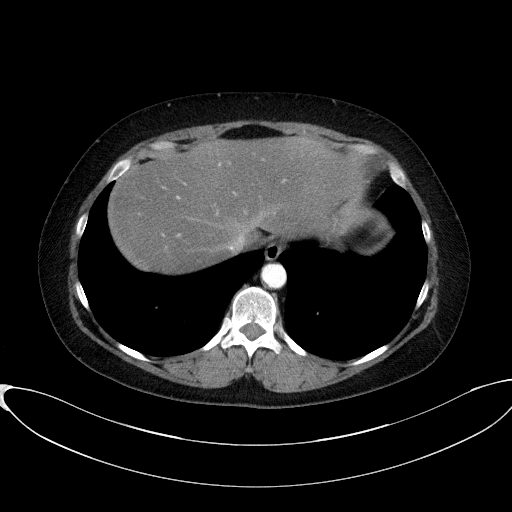
[im 96/138  mediastinal]
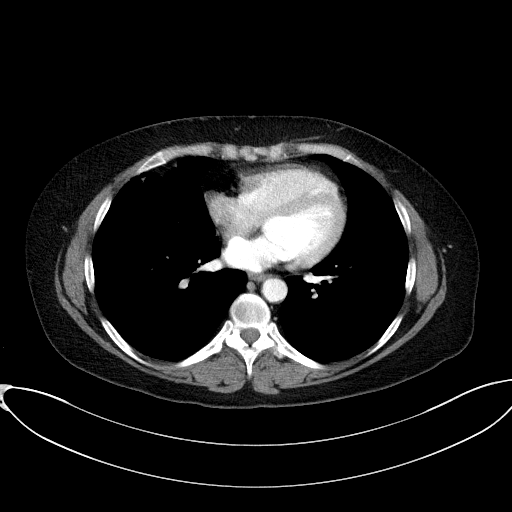
[im 110/138  mediastinal]
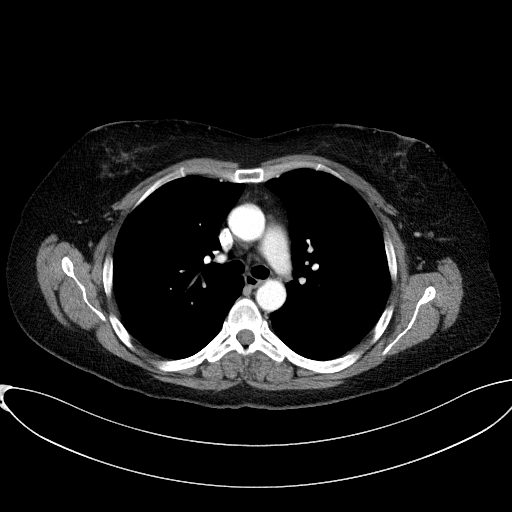
[im 124/138  mediastinal]
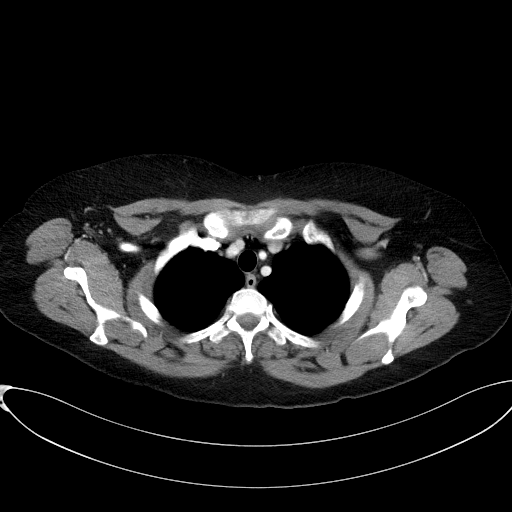
[im 124/138  bone]
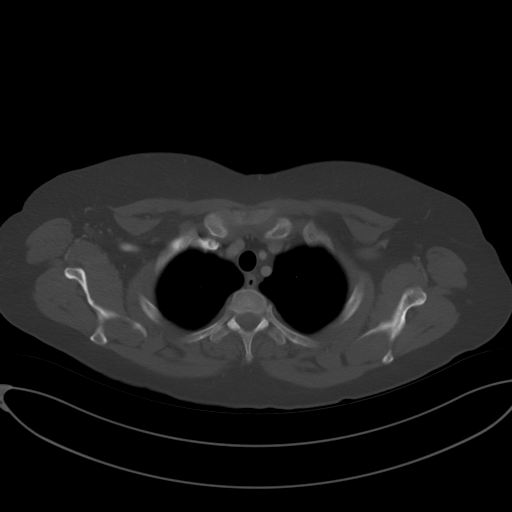

[Series 4: lung · axial · 0.67mm/px · 1 of 181 slices shown]
[im 13/181  bone]
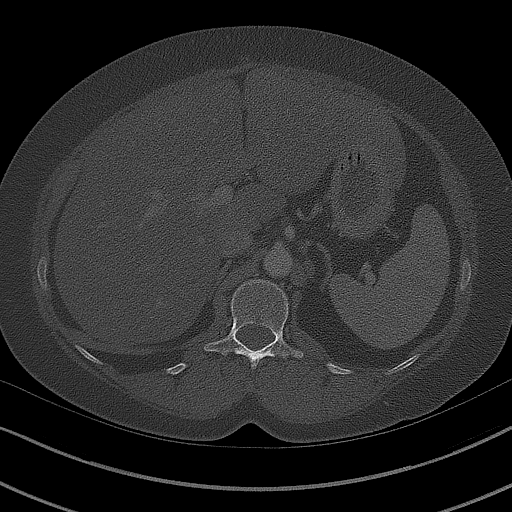

[Series 5: cap with 3mm st cor · coronal · 0.67mm/px · 3 of 146 slices shown]
[im 30/146  mediastinal]
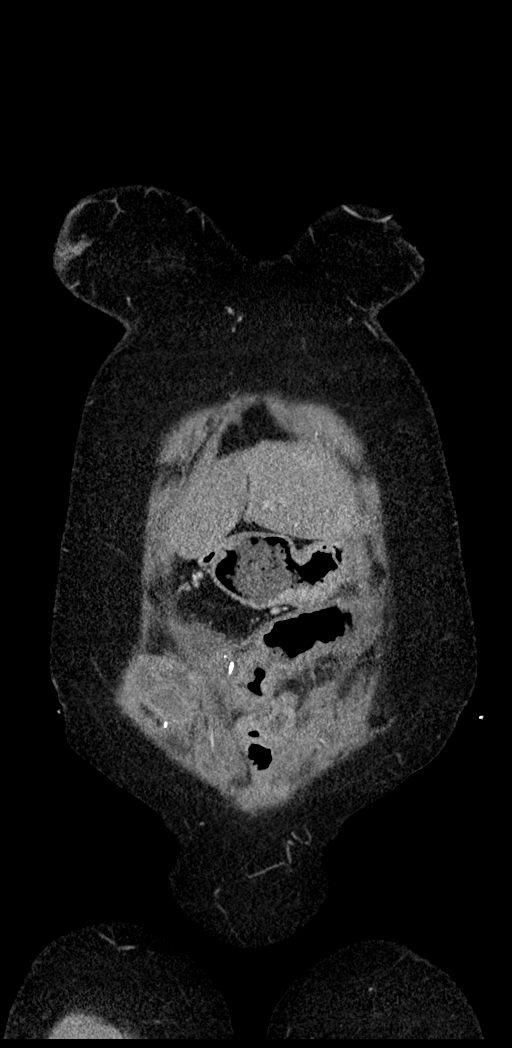
[im 59/146  mediastinal]
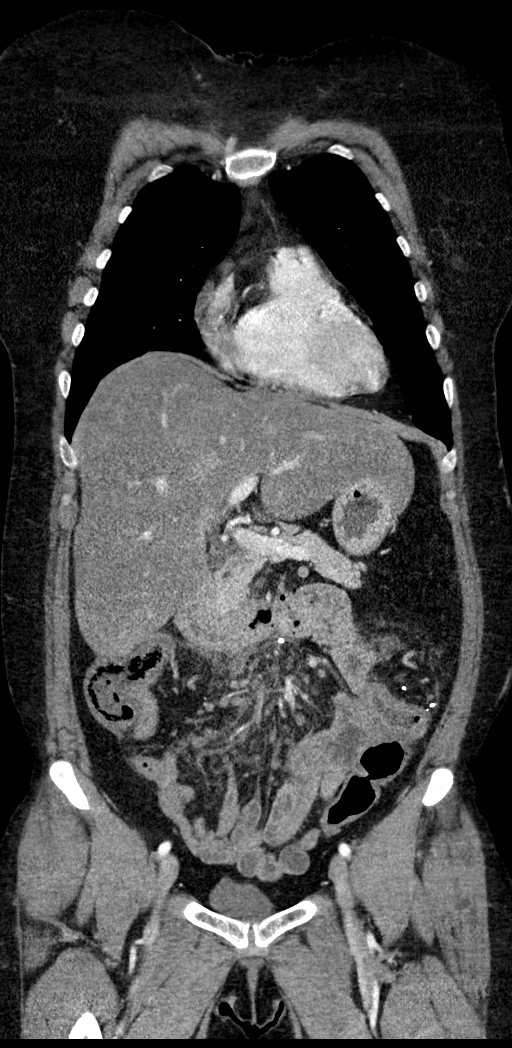
[im 88/146  mediastinal]
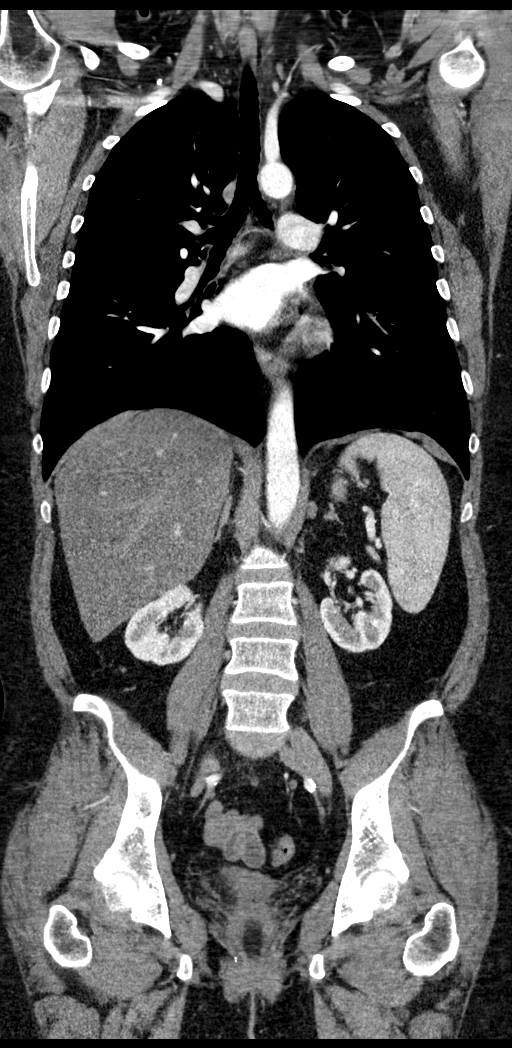

[13 of 36 positions shown; findings below may reference images not displayed]

RADIATION DOSE REDUCTION: This exam was performed according to the
departmental dose-optimization program which includes automated
exposure control, adjustment of the mA and/or kV according to
patient size and/or use of iterative reconstruction technique.

CONTRAST:  100mL OMNIPAQUE IOHEXOL 300 MG/ML  SOLN
FINDINGS: CT CHEST FINDINGS

Cardiovascular: Heart is normal size. Aorta is normal caliber.

Mediastinum/Nodes: No mediastinal, hilar, or axillary adenopathy.
Trachea and esophagus are unremarkable. Thyroid unremarkable.

Lungs/Pleura: Scarring in the right middle lobe. No acute confluent
opacities, effusions or nodules.

Musculoskeletal: Chest wall soft tissues are unremarkable. No acute
bony abnormality.

CT ABDOMEN PELVIS FINDINGS

Hepatobiliary: Prior cholecystectomy. Diffuse fatty infiltration of
the liver. No focal hepatic abnormality.

Pancreas: No focal abnormality or ductal dilatation.

Spleen: No focal abnormality.  Normal size.

Adrenals/Urinary Tract: No adrenal abnormality. No focal renal
abnormality. No stones or hydronephrosis. Urinary bladder is
unremarkable.

Stomach/Bowel: Postoperative changes. There is wall thickening noted
diffusely throughout the large bowel. Small bowel and stomach
grossly unremarkable.

Vascular/Lymphatic: Aortic atherosclerosis. No evidence of aneurysm
or adenopathy.

Reproductive: Prior hysterectomy.  No adnexal masses.

Other: Small amount of mesenteric and interloop fluid.  No free air.

Musculoskeletal: No acute bony abnormality.
IMPRESSION: No acute cardiopulmonary disease.

Hepatic steatosis.

Postoperative changes. Diffuse wall thickening involving the colon
compatible with colitis.

Aortic atherosclerosis.

Small amount of interloop and mesenteric fluid.

## 2023-06-10 ENCOUNTER — Other Ambulatory Visit: Payer: Self-pay | Admitting: Physician Assistant

## 2023-06-10 DIAGNOSIS — K651 Peritoneal abscess: Secondary | ICD-10-CM

## 2023-07-18 DEATH — deceased
